# Patient Record
Sex: Female | Born: 1970 | Race: White | Hispanic: Yes | Marital: Single | Smoking: Never smoker
Health system: Southern US, Community
[De-identification: ages and names within clinical notes are randomized; demographics above are authoritative.]

## PROBLEM LIST (undated history)

## (undated) DIAGNOSIS — I2699 Other pulmonary embolism without acute cor pulmonale: Secondary | ICD-10-CM

## (undated) DIAGNOSIS — N12 Tubulo-interstitial nephritis, not specified as acute or chronic: Secondary | ICD-10-CM

## (undated) HISTORY — PX: NO PAST SURGERIES: SHX2092

---

## 2000-10-08 ENCOUNTER — Ambulatory Visit (HOSPITAL_COMMUNITY): Admission: RE | Admit: 2000-10-08 | Discharge: 2000-10-08 | Payer: Self-pay | Admitting: *Deleted

## 2001-01-01 ENCOUNTER — Ambulatory Visit (HOSPITAL_COMMUNITY): Admission: RE | Admit: 2001-01-01 | Discharge: 2001-01-01 | Payer: Self-pay | Admitting: Obstetrics

## 2001-01-29 ENCOUNTER — Inpatient Hospital Stay (HOSPITAL_COMMUNITY): Admission: AD | Admit: 2001-01-29 | Discharge: 2001-02-01 | Payer: Self-pay | Admitting: *Deleted

## 2003-02-17 ENCOUNTER — Inpatient Hospital Stay (HOSPITAL_COMMUNITY): Admission: AD | Admit: 2003-02-17 | Discharge: 2003-02-18 | Payer: Self-pay | Admitting: Obstetrics & Gynecology

## 2003-02-18 ENCOUNTER — Encounter: Payer: Self-pay | Admitting: Obstetrics & Gynecology

## 2004-06-25 ENCOUNTER — Ambulatory Visit: Payer: Self-pay | Admitting: Family Medicine

## 2004-08-24 ENCOUNTER — Ambulatory Visit: Payer: Self-pay | Admitting: Obstetrics and Gynecology

## 2004-08-24 ENCOUNTER — Inpatient Hospital Stay (HOSPITAL_COMMUNITY): Admission: AD | Admit: 2004-08-24 | Discharge: 2004-08-26 | Payer: Self-pay | Admitting: *Deleted

## 2007-09-28 ENCOUNTER — Emergency Department (HOSPITAL_COMMUNITY): Admission: EM | Admit: 2007-09-28 | Discharge: 2007-09-28 | Payer: Self-pay | Admitting: Emergency Medicine

## 2007-11-06 ENCOUNTER — Emergency Department (HOSPITAL_COMMUNITY): Admission: EM | Admit: 2007-11-06 | Discharge: 2007-11-06 | Payer: Self-pay | Admitting: Emergency Medicine

## 2010-09-19 ENCOUNTER — Inpatient Hospital Stay (HOSPITAL_COMMUNITY)
Admission: AD | Admit: 2010-09-19 | Discharge: 2010-09-21 | DRG: 775 | Disposition: A | Discharge status: Home / Self Care | Payer: Medicaid Other | Source: Ambulatory Visit | Attending: Obstetrics and Gynecology | Admitting: Obstetrics and Gynecology

## 2010-09-19 ENCOUNTER — Inpatient Hospital Stay (HOSPITAL_COMMUNITY)
Admission: AD | Admit: 2010-09-19 | Discharge: 2010-09-19 | Disposition: A | Discharge status: Home / Self Care | Payer: Medicaid Other | Source: Ambulatory Visit | Attending: Obstetrics and Gynecology | Admitting: Obstetrics and Gynecology

## 2010-09-19 DIAGNOSIS — O479 False labor, unspecified: Secondary | ICD-10-CM

## 2010-09-19 DIAGNOSIS — O09529 Supervision of elderly multigravida, unspecified trimester: Secondary | ICD-10-CM

## 2010-09-19 LAB — CBC
HCT: 40.9 % (ref 36.0–46.0)
Hemoglobin: 14.4 g/dL (ref 12.0–15.0)
MCH: 31 pg (ref 26.0–34.0)
MCHC: 35.2 g/dL (ref 30.0–36.0)
MCV: 88 fL (ref 78.0–100.0)
Platelets: 233 K/uL (ref 150–400)
RBC: 4.65 MIL/uL (ref 3.87–5.11)
RDW: 13.6 % (ref 11.5–15.5)
WBC: 10.3 K/uL (ref 4.0–10.5)

## 2010-09-19 LAB — DIFFERENTIAL
Basophils Absolute: 0 10*3/uL (ref 0.0–0.1)
Basophils Relative: 0 % (ref 0–1)
Eosinophils Absolute: 0 10*3/uL (ref 0.0–0.7)
Eosinophils Relative: 0 % (ref 0–5)
Lymphocytes Relative: 23 % (ref 12–46)
Lymphs Abs: 2.4 10*3/uL (ref 0.7–4.0)
Monocytes Absolute: 0.5 10*3/uL (ref 0.1–1.0)
Monocytes Relative: 5 % (ref 3–12)
Neutro Abs: 7.4 10*3/uL (ref 1.7–7.7)
Neutrophils Relative %: 72 % (ref 43–77)

## 2010-09-19 LAB — HEPATITIS B SURFACE ANTIGEN: Hepatitis B Surface Ag: NEGATIVE

## 2010-09-19 LAB — ABO/RH: ABO/RH(D): A POS

## 2010-09-19 LAB — TYPE AND SCREEN
ABO/RH(D): A POS
Antibody Screen: NEGATIVE

## 2010-09-19 LAB — RPR: RPR Ser Ql: NONREACTIVE

## 2010-09-19 LAB — HIV ANTIBODY (ROUTINE TESTING W REFLEX): HIV: NONREACTIVE

## 2010-09-19 LAB — RUBELLA SCREEN: Rubella: 52.2 IU/mL — ABNORMAL HIGH

## 2010-09-20 DIAGNOSIS — O479 False labor, unspecified: Secondary | ICD-10-CM

## 2010-09-21 DIAGNOSIS — O479 False labor, unspecified: Secondary | ICD-10-CM

## 2010-09-21 LAB — STREP B DNA PROBE

## 2011-04-25 LAB — URINE MICROSCOPIC-ADD ON

## 2011-04-25 LAB — URINE CULTURE

## 2011-04-25 LAB — I-STAT 8, (EC8 V) (CONVERTED LAB)
BUN: 5 — ABNORMAL LOW
Bicarbonate: 24.4 — ABNORMAL HIGH
HCT: 45
Hemoglobin: 15.3 — ABNORMAL HIGH
Operator id: 294501
Sodium: 139

## 2011-04-25 LAB — DIFFERENTIAL
Basophils Absolute: 0
Basophils Relative: 1
Monocytes Relative: 3
Neutro Abs: 6.3
Neutrophils Relative %: 67

## 2011-04-25 LAB — URINALYSIS, ROUTINE W REFLEX MICROSCOPIC
Nitrite: POSITIVE — AB
Specific Gravity, Urine: 1.013
Urobilinogen, UA: 0.2

## 2011-04-25 LAB — CBC
MCHC: 34.4
RBC: 5.14 — ABNORMAL HIGH
RDW: 13

## 2011-04-29 LAB — DIFFERENTIAL
Basophils Absolute: 0
Basophils Relative: 0
Lymphocytes Relative: 25
Monocytes Absolute: 0.3
Neutro Abs: 6.7
Neutrophils Relative %: 70

## 2011-04-29 LAB — COMPREHENSIVE METABOLIC PANEL
Albumin: 4.3
Alkaline Phosphatase: 64
BUN: 4 — ABNORMAL LOW
Chloride: 104
Creatinine, Ser: 0.61
GFR calc non Af Amer: >60
Glucose, Bld: 86
Total Bilirubin: 0.7

## 2011-04-29 LAB — WET PREP, GENITAL
Trich, Wet Prep: NONE SEEN
Yeast Wet Prep HPF POC: NONE SEEN

## 2011-04-29 LAB — URINALYSIS, ROUTINE W REFLEX MICROSCOPIC
Nitrite: POSITIVE — AB
Specific Gravity, Urine: 1.014
Urobilinogen, UA: 2 — ABNORMAL HIGH
pH: 6

## 2011-04-29 LAB — CBC
HCT: 45.5
Hemoglobin: 15.7 — ABNORMAL HIGH
MCV: 85.6
Platelets: 276
WBC: 9.7

## 2011-04-29 LAB — URINE CULTURE: Colony Count: 2000

## 2011-04-29 LAB — URINE MICROSCOPIC-ADD ON

## 2011-04-29 LAB — LIPASE, BLOOD: Lipase: 22

## 2013-03-26 ENCOUNTER — Encounter (HOSPITAL_COMMUNITY): Payer: Self-pay | Admitting: Emergency Medicine

## 2013-03-26 ENCOUNTER — Emergency Department (HOSPITAL_COMMUNITY)
Admission: EM | Admit: 2013-03-26 | Discharge: 2013-03-26 | Disposition: A | Discharge status: Home / Self Care | Payer: Self-pay | Attending: Emergency Medicine | Admitting: Emergency Medicine

## 2013-03-26 DIAGNOSIS — R5381 Other malaise: Secondary | ICD-10-CM | POA: Insufficient documentation

## 2013-03-26 DIAGNOSIS — Z3202 Encounter for pregnancy test, result negative: Secondary | ICD-10-CM | POA: Insufficient documentation

## 2013-03-26 DIAGNOSIS — R42 Dizziness and giddiness: Secondary | ICD-10-CM | POA: Insufficient documentation

## 2013-03-26 DIAGNOSIS — R531 Weakness: Secondary | ICD-10-CM

## 2013-03-26 LAB — CBC WITH DIFFERENTIAL/PLATELET
Basophils Absolute: 0 10*3/uL (ref 0.0–0.1)
Basophils Relative: 0 % (ref 0–1)
Lymphocytes Relative: 34 % (ref 12–46)
Neutro Abs: 2.9 10*3/uL (ref 1.7–7.7)
Platelets: 221 10*3/uL (ref 150–400)
RDW: 13 % (ref 11.5–15.5)
WBC: 4.8 10*3/uL (ref 4.0–10.5)

## 2013-03-26 LAB — POCT I-STAT, CHEM 8
Glucose, Bld: 106 mg/dL — ABNORMAL HIGH (ref 70–99)
HCT: 43 % (ref 36.0–46.0)
Hemoglobin: 14.6 g/dL (ref 12.0–15.0)
Potassium: 3.5 mEq/L (ref 3.5–5.1)
Sodium: 141 mEq/L (ref 135–145)
TCO2: 24 mmol/L (ref 0–100)

## 2013-03-26 LAB — URINALYSIS, ROUTINE W REFLEX MICROSCOPIC
Ketones, ur: NEGATIVE mg/dL
Leukocytes, UA: NEGATIVE
Nitrite: NEGATIVE
Specific Gravity, Urine: 1.01 (ref 1.005–1.030)
Urobilinogen, UA: 0.2 mg/dL (ref 0.0–1.0)
pH: 8 (ref 5.0–8.0)

## 2013-03-26 LAB — URINE MICROSCOPIC-ADD ON

## 2013-03-26 MED ORDER — SODIUM CHLORIDE 0.9 % IV BOLUS (SEPSIS)
1000.0000 mL | Freq: Once | INTRAVENOUS | Status: AC
  Administered 2013-03-26: 1000 mL via INTRAVENOUS

## 2013-03-26 NOTE — ED Notes (Signed)
Patient discharged with instructions.

## 2013-03-26 NOTE — Progress Notes (Signed)
Met Patient at bedside.Role of case manager explained.Patient reports her understanding.Patient reports she attends the [facility] and has seen her PCP in the last 12 months.Patient  Reports she has no needs at this time.Patient education provided on the [facility] on [address].No further case management needs.

## 2013-03-26 NOTE — ED Notes (Signed)
Patient advised that she had an onset of pain in the back from the neck down to the lower back, after lifting boxes all day at work. Patient advises that at midnight the pain got worse. Denies fever or other problems.

## 2013-03-26 NOTE — ED Provider Notes (Signed)
CSN: [id number]     Arrival date & time 03/26/13  6578 History     First MD Initiated Contact with Patient 03/26/13 [phone]     No chief complaint on file.  (Consider location/radiation/quality/duration/timing/severity/associated sxs/prior Treatment) HPI  42 year old female presents complaining of "I'm not feeling well". History obtained through patient. Patient states since yesterday afternoon she has been feeling more tired than usual. Reports having lightheadedness, generalized weakness, and lack of energy. She started menstrual period yesterday as well. Otherwise she denies any other specific complaint. Specifically, denies any fever, chills, headache, double vision, chest pain, shortness of breath, productive cough, hemoptysis, back pain, carpal pain, dysuria or hematuria, numbness, localized weakness, or rash. She has been walking her job as usual. Did perform heavy lifting all day at work but this is not unusual for her. She has been drinking appropriately. She denies any recent records drugs alcohol use. She is a nonsmoker.  No past medical history on file. No past surgical history on file. No family history on file. History  Substance Use Topics  . Smoking status: Not on file  . Smokeless tobacco: Not on file  . Alcohol Use: Not on file   OB History   No data available     Review of Systems  All other systems reviewed and are negative.    Allergies  Review of patient's allergies indicates no known allergies.  Home Medications   Current Outpatient Rx  Name  Route  Sig  Dispense  Refill  . aspirin 325 MG tablet   Oral   Take 325 mg by mouth daily as needed for pain.         . Multiple Vitamins-Minerals (MULTI-VITAMIN GUMMIES PO)   Oral   Take 1 each by mouth daily.          BP 113/80  Pulse 100  Temp(Src) 98.6 F (37 C) (Oral)  SpO2 100% Physical Exam  Nursing note and vitals reviewed. Constitutional: She is oriented to person, place, and time. She  appears well-developed and well-nourished. No distress.  Awake, alert, nontoxic appearance  HENT:  Head: Atraumatic.  Mouth/Throat: Oropharynx is clear and moist.  Eyes: Conjunctivae and EOM are normal. Pupils are equal, round, and reactive to light. Right eye exhibits no discharge. Left eye exhibits no discharge.  Neck: Neck supple.  Cardiovascular: Normal rate and regular rhythm.   Pulmonary/Chest: Effort normal. No respiratory distress. She exhibits no tenderness.  Abdominal: Soft. There is no tenderness. There is no rebound.  Musculoskeletal: She exhibits no tenderness.  ROM appears intact, no obvious focal weakness  No significant midline spine tenderness.  Neurological: She is alert and oriented to person, place, and time. She has normal strength. No cranial nerve deficit or sensory deficit. She displays a negative Romberg sign. Coordination and gait normal. GCS eye subscore is 4. GCS verbal subscore is 5. GCS motor subscore is 6.  Mental status and motor strength appears intact  5 out 5 strength to all 4 extremities. Patellar deep tendon reflexes 2+ bilaterally. Normal gait.  Skin: No rash noted.  Psychiatric: She has a normal mood and affect.    ED Course   Procedures (including critical care time)   Date: 03/26/2013  Rate: 89  Rhythm: normal sinus rhythm  QRS Axis: normal  Intervals: normal  ST/T Wave abnormalities: normal  Conduction Disutrbances: none  Narrative Interpretation:   Old EKG Reviewed: none for comparison     Patient reports complaining of lightheadedness and generalized weakness. Patient  is currently on her menstruation. Her vital signs stable although she is mildly tachycardic. Plan to give IV fluid, near syncope workup initiated.  12:59 PM Patient has no focal neuro deficit. Her heart rate 22 beats per minute more from laying position to a standing position however her blood pressure remains stable. Patient has been receiving IV fluid. UA shows  hemoglobin likely secondary to her menstruation. Otherwise no evidence of urinary tract infection. Her pregnancy test is negative, normal CBG, normal left normal electrolytes, CBC shows evidence of a hemoglobin of 15.2 likely due to concentration.  1:05 PM Patient reports feeling better after receiving IV fluid. She is able to tolerates by mouth. She does have a primary care doctor in which she can followup. I believe patient is stable for discharge. Return precautions discussed.  Labs Reviewed  CBC WITH DIFFERENTIAL - Abnormal; Notable for the following:    Hemoglobin 15.2 (*)    MCHC 36.7 (*)    All other components within normal limits  URINALYSIS, ROUTINE W REFLEX MICROSCOPIC - Abnormal; Notable for the following:    Color, Urine RED (*)    Hgb urine dipstick LARGE (*)    All other components within normal limits  POCT I-STAT, CHEM 8 - Abnormal; Notable for the following:    Glucose, Bld 106 (*)    All other components within normal limits  GLUCOSE, CAPILLARY  URINE MICROSCOPIC-ADD ON  CG4 I-STAT (LACTIC ACID)  POCT PREGNANCY, URINE   No results found. 1. Weakness generalized     MDM  BP 108/79  Pulse 112  Temp(Src) 98.6 F (37 C) (Oral)  Resp 15  SpO2 99%  LMP 03/25/2013   Fayrene Helper, PA-C 03/26/13 1320

## 2013-03-26 NOTE — ED Provider Notes (Signed)
Medical screening examination/treatment/procedure(s) were performed by non-physician practitioner and as supervising physician I was immediately available for consultation/collaboration.   William Tanecia Mccay, MD 03/26/13 1457 

## 2013-04-01 ENCOUNTER — Emergency Department (HOSPITAL_COMMUNITY): Payer: Self-pay

## 2013-04-01 ENCOUNTER — Emergency Department (HOSPITAL_COMMUNITY)
Admission: EM | Admit: 2013-04-01 | Discharge: 2013-04-01 | Disposition: A | Discharge status: Home / Self Care | Payer: Self-pay | Attending: Emergency Medicine | Admitting: Emergency Medicine

## 2013-04-01 ENCOUNTER — Encounter (HOSPITAL_COMMUNITY): Payer: Self-pay | Admitting: Emergency Medicine

## 2013-04-01 DIAGNOSIS — R51 Headache: Secondary | ICD-10-CM | POA: Insufficient documentation

## 2013-04-01 DIAGNOSIS — R42 Dizziness and giddiness: Secondary | ICD-10-CM | POA: Insufficient documentation

## 2013-04-01 DIAGNOSIS — R5381 Other malaise: Secondary | ICD-10-CM | POA: Insufficient documentation

## 2013-04-01 DIAGNOSIS — Z3202 Encounter for pregnancy test, result negative: Secondary | ICD-10-CM | POA: Insufficient documentation

## 2013-04-01 LAB — URINALYSIS, ROUTINE W REFLEX MICROSCOPIC
Bilirubin Urine: NEGATIVE
Glucose, UA: NEGATIVE mg/dL
Hgb urine dipstick: NEGATIVE
Specific Gravity, Urine: 1.011 (ref 1.005–1.030)
Urobilinogen, UA: 0.2 mg/dL (ref 0.0–1.0)
pH: 7.5 (ref 5.0–8.0)

## 2013-04-01 LAB — BASIC METABOLIC PANEL
CO2: 25 mEq/L (ref 19–32)
Calcium: 9.8 mg/dL (ref 8.4–10.5)
Chloride: 102 mEq/L (ref 96–112)
Creatinine, Ser: 0.66 mg/dL (ref 0.50–1.10)
Glucose, Bld: 104 mg/dL — ABNORMAL HIGH (ref 70–99)

## 2013-04-01 LAB — CBC WITH DIFFERENTIAL/PLATELET
Eosinophils Relative: 0 % (ref 0–5)
HCT: 44.4 % (ref 36.0–46.0)
Hemoglobin: 16 g/dL — ABNORMAL HIGH (ref 12.0–15.0)
Lymphocytes Relative: 13 % (ref 12–46)
Lymphs Abs: 1.2 10*3/uL (ref 0.7–4.0)
MCV: 84.3 fL (ref 78.0–100.0)
Monocytes Absolute: 0.2 10*3/uL (ref 0.1–1.0)
Monocytes Relative: 2 % — ABNORMAL LOW (ref 3–12)
Neutro Abs: 7.5 10*3/uL (ref 1.7–7.7)
RBC: 5.27 MIL/uL — ABNORMAL HIGH (ref 3.87–5.11)
WBC: 9 10*3/uL (ref 4.0–10.5)

## 2013-04-01 LAB — PREGNANCY, URINE: Preg Test, Ur: NEGATIVE

## 2013-04-01 LAB — URINE MICROSCOPIC-ADD ON

## 2013-04-01 MED ORDER — ONDANSETRON HCL 4 MG/2ML IJ SOLN
4.0000 mg | Freq: Once | INTRAMUSCULAR | Status: AC
  Administered 2013-04-01: 4 mg via INTRAVENOUS
  Filled 2013-04-01: qty 2

## 2013-04-01 MED ORDER — MECLIZINE HCL 25 MG PO TABS: 25.0000 mg | ORAL_TABLET | Freq: Four times a day (QID) | ORAL | Status: DC

## 2013-04-01 MED ORDER — MECLIZINE HCL 25 MG PO TABS
25.0000 mg | ORAL_TABLET | Freq: Once | ORAL | Status: AC
  Administered 2013-04-01: 25 mg via ORAL
  Filled 2013-04-01: qty 1

## 2013-04-01 MED ORDER — KETOROLAC TROMETHAMINE 30 MG/ML IJ SOLN
30.0000 mg | Freq: Once | INTRAMUSCULAR | Status: AC
  Administered 2013-04-01: 30 mg via INTRAVENOUS
  Filled 2013-04-01: qty 1

## 2013-04-01 MED ORDER — SODIUM CHLORIDE 0.9 % IV BOLUS (SEPSIS)
1000.0000 mL | Freq: Once | INTRAVENOUS | Status: AC
  Administered 2013-04-01: 1000 mL via INTRAVENOUS

## 2013-04-01 MED ORDER — ONDANSETRON HCL 4 MG PO TABS: 4.0000 mg | ORAL_TABLET | Freq: Four times a day (QID) | ORAL | Status: DC

## 2013-04-01 NOTE — ED Provider Notes (Signed)
Medical screening examination/treatment/procedure(s) were performed by non-physician practitioner and as supervising physician I was immediately available for consultation/collaboration.   Fauna Neuner S Kori Goins, MD 04/01/13 1626 

## 2013-04-01 NOTE — ED Provider Notes (Signed)
CSN: [id number]     Arrival date & time 04/01/13  0630 History   First MD Initiated Contact with Patient 04/01/13 [phone]     Chief Complaint  Patient presents with  . Dizziness  . Headache   (Consider location/radiation/quality/duration/timing/severity/associated sxs/prior Treatment) HPI  Megan Cantu is a 42 y.o.female without any significant PMH presents to the ER with complaints of headache, generalized mild weakness, dizziness for 1 week that is worse at night when she lays down. She describes it as rocking on a boat as opposed to spinning around in circles. She says her headache is very bothersome. She is unable to get into her PCP for another week and does not feel any better since her last headache. She feels as though something in her head is not right and requests "a brain test". She has had some coughing. No sore throat, ear pain, chest pain, abdominal pain, nausea, vomiting, diarrhea. No focal weakness, change in vision. She is still on her menstrual cycle and describes it as normal flow.     History reviewed. No pertinent past medical history. History reviewed. No pertinent past surgical history. History reviewed. No pertinent family history. History  Substance Use Topics  . Smoking status: Never Smoker   . Smokeless tobacco: Not on file  . Alcohol Use: No   OB History   Grav Para Term Preterm Abortions TAB SAB Ect Mult Living                 Review of Systems ROS is negative unless otherwise stated in the HPI  Allergies  Review of patient's allergies indicates no known allergies.  Home Medications   Current Outpatient Rx  Name  Route  Sig  Dispense  Refill  . aspirin 325 MG tablet   Oral   Take 325 mg by mouth daily as needed for pain.         . Multiple Vitamins-Minerals (MULTI-VITAMIN GUMMIES PO)   Oral   Take 1 each by mouth daily.         . meclizine (ANTIVERT) 25 MG tablet   Oral   Take 1 tablet (25 mg total) by mouth 4 (four) times daily.   28  tablet   0   . ondansetron (ZOFRAN) 4 MG tablet   Oral   Take 1 tablet (4 mg total) by mouth every 6 (six) hours.   12 tablet   0    BP 92/59  Pulse 75  Temp(Src) 98.4 F (36.9 C) (Oral)  Resp 14  SpO2 100%  LMP 03/25/2013 Physical Exam  Nursing note and vitals reviewed. Constitutional: She is oriented to person, place, and time. She appears well-developed and well-nourished. No distress.  HENT:  Head: Normocephalic and atraumatic.  Eyes: Pupils are equal, round, and reactive to light.  Neck: Normal range of motion. Neck supple.  Cardiovascular: Normal rate and regular rhythm.   Pulmonary/Chest: Effort normal.  Abdominal: Soft.  Neurological: She is alert and oriented to person, place, and time. She has normal strength. No cranial nerve deficit or sensory deficit. She displays a negative Romberg sign.  Dizziness increased with rapid side to side head movements  Skin: Skin is warm and dry.    ED Course  Procedures (including critical care time) Labs Review Labs Reviewed  URINALYSIS, ROUTINE W REFLEX MICROSCOPIC - Abnormal; Notable for the following:    APPearance CLOUDY (*)    Leukocytes, UA TRACE (*)    All other components within normal limits  CBC  WITH DIFFERENTIAL - Abnormal; Notable for the following:    RBC 5.27 (*)    Hemoglobin 16.0 (*)    Neutrophils Relative % 84 (*)    Monocytes Relative 2 (*)    All other components within normal limits  BASIC METABOLIC PANEL - Abnormal; Notable for the following:    Glucose, Bld 104 (*)    All other components within normal limits  URINE MICROSCOPIC-ADD ON - Abnormal; Notable for the following:    Squamous Epithelial / LPF MANY (*)    Bacteria, UA FEW (*)    All other components within normal limits  URINE CULTURE  PREGNANCY, URINE   Imaging Review Ct Head Wo Contrast  04/01/2013   CLINICAL DATA:  Dizziness. Headache and back pain.  EXAM: CT HEAD WITHOUT CONTRAST  TECHNIQUE: Contiguous axial images were obtained  from the base of the skull through the vertex without intravenous contrast.  COMPARISON:  None.  FINDINGS: The brainstem, cerebellum, cerebral peduncles, thalamus, basal ganglia, basilar cisterns, and ventricular system appear within normal limits. No intracranial hemorrhage, mass lesion, or acute CVA.  IMPRESSION: No significant abnormality identified.   Electronically Signed   By: Herbie Baltimore   On: 04/01/2013 08:07    MDM   1. Vertigo      Pt given 25mg  IV Antivert, 4mg  Zofran IV and 1 L NS. She endorses no longer being dizzy (and shakes her head around to check) but she is still having headache pain. Head CT is normal therefore will proceed to treat pain. Labs still pending.   Patient feeling much better. Headache has resolved. Head CT negative. Labs normal.  Patient to follow-up with PCP.  Rx> meclizine and Zofran  41 y.o.Megan Cantu's evaluation in the Emergency Department is complete. It has been determined that no acute conditions requiring further emergency intervention are present at this time. The patient/guardian have been advised of the diagnosis and plan. We have discussed signs and symptoms that warrant return to the ED, such as changes or worsening in symptoms.  Vital signs are stable at discharge. Filed Vitals:   04/01/13 1036  BP: 92/59  Pulse: 75  Temp: 98.4 F (36.9 C)  Resp: 14    Patient/guardian has voiced understanding and agreed to follow-up with the PCP or specialist.   Dorthula Matas, PA-C 04/01/13 1059

## 2013-04-01 NOTE — ED Notes (Signed)
Was seen here saturday 8/23 for same-- c/o dizziness, headache and back pain. ALert.Oriented x 3, no vomiting.

## 2013-04-02 LAB — URINE CULTURE: Colony Count: 5000

## 2014-04-05 ENCOUNTER — Emergency Department (HOSPITAL_COMMUNITY)
Admission: EM | Admit: 2014-04-05 | Discharge: 2014-04-05 | Disposition: A | Discharge status: Home / Self Care | Payer: Medicaid Other | Attending: Emergency Medicine | Admitting: Emergency Medicine

## 2014-04-05 ENCOUNTER — Encounter (HOSPITAL_COMMUNITY): Payer: Self-pay | Admitting: Emergency Medicine

## 2014-04-05 DIAGNOSIS — R000 Tachycardia, unspecified: Secondary | ICD-10-CM | POA: Insufficient documentation

## 2014-04-05 DIAGNOSIS — Z79899 Other long term (current) drug therapy: Secondary | ICD-10-CM | POA: Insufficient documentation

## 2014-04-05 DIAGNOSIS — N12 Tubulo-interstitial nephritis, not specified as acute or chronic: Secondary | ICD-10-CM | POA: Diagnosis present

## 2014-04-05 DIAGNOSIS — R109 Unspecified abdominal pain: Secondary | ICD-10-CM | POA: Insufficient documentation

## 2014-04-05 LAB — URINALYSIS, ROUTINE W REFLEX MICROSCOPIC
Bilirubin Urine: NEGATIVE
Glucose, UA: NEGATIVE mg/dL
Ketones, ur: NEGATIVE mg/dL
Nitrite: NEGATIVE
PROTEIN: 100 mg/dL — AB
Specific Gravity, Urine: 1.007 (ref 1.005–1.030)
Urobilinogen, UA: 1 mg/dL (ref 0.0–1.0)
pH: 7 (ref 5.0–8.0)

## 2014-04-05 LAB — CBC WITH DIFFERENTIAL/PLATELET
Basophils Absolute: 0 10*3/uL (ref 0.0–0.1)
Basophils Relative: 0 % (ref 0–1)
EOS ABS: 0 10*3/uL (ref 0.0–0.7)
Eosinophils Relative: 0 % (ref 0–5)
HCT: 38.3 % (ref 36.0–46.0)
Hemoglobin: 13.7 g/dL (ref 12.0–15.0)
LYMPHS ABS: 1.4 10*3/uL (ref 0.7–4.0)
Lymphocytes Relative: 14 % (ref 12–46)
MCH: 30.2 pg (ref 26.0–34.0)
MCHC: 35.8 g/dL (ref 30.0–36.0)
MCV: 84.4 fL (ref 78.0–100.0)
Monocytes Absolute: 0.6 10*3/uL (ref 0.1–1.0)
Monocytes Relative: 6 % (ref 3–12)
Neutro Abs: 7.6 10*3/uL (ref 1.7–7.7)
Neutrophils Relative %: 80 % — ABNORMAL HIGH (ref 43–77)
PLATELETS: 217 10*3/uL (ref 150–400)
RBC: 4.54 MIL/uL (ref 3.87–5.11)
RDW: 12.5 % (ref 11.5–15.5)
WBC: 9.7 10*3/uL (ref 4.0–10.5)

## 2014-04-05 LAB — COMPREHENSIVE METABOLIC PANEL
ALBUMIN: 3.5 g/dL (ref 3.5–5.2)
ALK PHOS: 56 U/L (ref 39–117)
ALT: 8 U/L (ref 0–35)
AST: 12 U/L (ref 0–37)
Anion gap: 12 (ref 5–15)
BUN: 9 mg/dL (ref 6–23)
CALCIUM: 9.4 mg/dL (ref 8.4–10.5)
CO2: 24 mEq/L (ref 19–32)
Chloride: 103 mEq/L (ref 96–112)
Creatinine, Ser: 0.7 mg/dL (ref 0.50–1.10)
GFR calc non Af Amer: >90 mL/min (ref >90)
GLUCOSE: 111 mg/dL — AB (ref 70–99)
Potassium: 3.7 mEq/L (ref 3.7–5.3)
SODIUM: 139 meq/L (ref 137–147)
TOTAL PROTEIN: 7.3 g/dL (ref 6.0–8.3)
Total Bilirubin: 0.6 mg/dL (ref 0.3–1.2)

## 2014-04-05 LAB — URINE MICROSCOPIC-ADD ON

## 2014-04-05 LAB — I-STAT CG4 LACTIC ACID, ED: Lactic Acid, Venous: 0.38 mmol/L — ABNORMAL LOW (ref 0.5–2.2)

## 2014-04-05 MED ORDER — SODIUM CHLORIDE 0.9 % IV BOLUS (SEPSIS)
1000.0000 mL | Freq: Once | INTRAVENOUS | Status: AC
  Administered 2014-04-05: 1000 mL via INTRAVENOUS

## 2014-04-05 MED ORDER — DEXTROSE 5 % IV SOLN
1.0000 g | INTRAVENOUS | Status: DC
  Administered 2014-04-05: 1 g via INTRAVENOUS
  Filled 2014-04-05: qty 10

## 2014-04-05 MED ORDER — PHENAZOPYRIDINE HCL 200 MG PO TABS: 200.0000 mg | ORAL_TABLET | Freq: Three times a day (TID) | ORAL | Status: DC | PRN

## 2014-04-05 MED ORDER — SODIUM CHLORIDE 0.9 % IV BOLUS (SEPSIS): 1000.0000 mL | Freq: Once | INTRAVENOUS | Status: DC

## 2014-04-05 MED ORDER — CEPHALEXIN 500 MG PO CAPS: 500.0000 mg | ORAL_CAPSULE | Freq: Three times a day (TID) | ORAL | Status: AC

## 2014-04-05 MED ORDER — HYDROCODONE-ACETAMINOPHEN 5-325 MG PO TABS: 1.0000 | ORAL_TABLET | ORAL | Status: DC | PRN

## 2014-04-05 MED ORDER — MORPHINE SULFATE 4 MG/ML IJ SOLN
4.0000 mg | Freq: Once | INTRAMUSCULAR | Status: AC
  Administered 2014-04-05: 4 mg via INTRAVENOUS
  Filled 2014-04-05: qty 1

## 2014-04-05 MED ORDER — ACETAMINOPHEN 500 MG PO TABS
1000.0000 mg | ORAL_TABLET | Freq: Once | ORAL | Status: AC
  Administered 2014-04-05: 1000 mg via ORAL
  Filled 2014-04-05: qty 2

## 2014-04-05 NOTE — ED Notes (Signed)
Pt in c/o dysuria and frequent urination for the last few days, c/o body aches and chills, c/o lower abd pain and lower back pain that started today

## 2014-04-05 NOTE — ED Notes (Signed)
Pt tolerated drinking water before discharge.

## 2014-04-05 NOTE — ED Provider Notes (Signed)
43 year old female presents with several days of worsening lower abdominal pain with radiation to the flank, dysuria. On exam she has a soft abdomen with tenderness in the suprapubic region with no guarding, no masses, no peritoneal signs. Tachycardic to 115 beats per minute on my exam, strong pulses, moist mucous membranes, no nausea or vomiting.  Urinalysis confirms urinary tract infection, likely early pyelonephritis or cystitis, no leukocytosis, no fever, fluids, pain medicine, antibiotics.  On recheck, Pulse< 105, BP stayed OK, no fever, no SIRS, abx given and UCx requested  I saw and evaluated the patient, reviewed the resident's note and I agree with the findings and plan.   Vida Roller, MD 04/06/14 [phone]

## 2014-04-05 NOTE — Discharge Instructions (Signed)
Pielonefritis - Adultos  °(Pyelonephritis, Adult) ° La pielonefritis es una infección del riñón. Puede curarse rápidamente o durar mucho tiempo.  °CUIDADOS EN EL HOGAR  °· Tome los medicamentos (antibióticos) tal como se le indicó. Finalice la prescripción completa, aunque se sienta mejor. °· Cumpla con los controles médicos según las indicaciones. °· Beba gran cantidad de líquido para mantener la orina de tono claro o color amarillo pálido. °· Sólo tome la medicación según las indicaciones. °SOLICITE AYUDA DE INMEDIATO SI:  °· Tiene fiebre o síntomas que persisten durante más de 2-3 días. °· Tiene fiebre y los síntomas empeoran. °· No puede tomar los medicamentos o beber líquidos según las indicaciones. °· Siente escalofríos o tiene fiebre. °· Se siente débil o se desvanece (se desmaya). °· No mejora luego de 2 días. °ASEGÚRESE DE QUE:  °· Comprende estas instrucciones. °· Controlará su enfermedad. °· Solicitará ayuda de inmediato si no mejora o empeora. °Document Released: 07/21/2005 Document Revised: 01/20/2012 °ExitCare® Patient Information ©2015 [employer]. This information is not intended to replace advice given to you by your health care provider. Make sure you discuss any questions you have with your health care provider. ° °

## 2014-04-05 NOTE — ED Provider Notes (Signed)
CSN: [id number]     Arrival date & time 04/05/14  2011 History   First MD Initiated Contact with Patient 04/05/14 2039     Chief Complaint  Patient presents with  . Dysuria  . Abdominal Pain     (Consider location/radiation/quality/duration/timing/severity/associated sxs/prior Treatment) HPI Comments: Awa Bachicha 43 y.o. With no known medical problems presents with left flank pain, dysuria and suprapubic abdominal pain. Dysuria characterized as burning and started 4-5 days ago and has been worsening. She is also having suprapubic abdominal pain. Pain is aching in character, non radiating, worsened with urinating. No known relieving factors. No hematuria. No history of UTIs or pyelonephritis. Sharp left flank pain started in the past 2 days.   Patient is a 43 y.o. female presenting with flank pain. The history is provided by the patient.  Flank Pain This is a new problem. The current episode started yesterday. The problem occurs constantly. The problem has been gradually worsening. Associated symptoms include abdominal pain, chills and urinary symptoms. Pertinent negatives include no anorexia, arthralgias, change in bowel habit, chest pain, congestion, coughing, diaphoresis, fatigue, headaches, joint swelling, myalgias, nausea, numbness, rash, sore throat, swollen glands, vertigo, visual change, vomiting or weakness. Associated symptoms comments: Dysuria. Nothing aggravates the symptoms. She has tried nothing for the symptoms.    History reviewed. No pertinent past medical history. History reviewed. No pertinent past surgical history. History reviewed. No pertinent family history. History  Substance Use Topics  . Smoking status: Never Smoker   . Smokeless tobacco: Not on file  . Alcohol Use: No   OB History   Grav Para Term Preterm Abortions TAB SAB Ect Mult Living                 Review of Systems  Constitutional: Positive for chills. Negative for diaphoresis and fatigue.  HENT:  Negative for congestion and sore throat.   Respiratory: Negative for cough.   Cardiovascular: Negative for chest pain.  Gastrointestinal: Positive for abdominal pain. Negative for nausea, vomiting, anorexia and change in bowel habit.  Genitourinary: Positive for flank pain.  Musculoskeletal: Negative for arthralgias, joint swelling and myalgias.  Skin: Negative for rash.  Neurological: Negative for vertigo, weakness, numbness and headaches.  All other systems reviewed and are negative.     Allergies  Review of patient's allergies indicates no known allergies.  Home Medications   Prior to Admission medications   Medication Sig Start Date End Date Taking? Authorizing Provider  aspirin 325 MG tablet Take 325 mg by mouth daily as needed for pain.   Yes Historical Provider, MD  meclizine (ANTIVERT) 25 MG tablet Take 1 tablet (25 mg total) by mouth 4 (four) times daily. 04/01/13  Yes Tiffany Irine Seal, PA-C  Multiple Vitamins-Minerals (MULTI-VITAMIN GUMMIES PO) Take 1 each by mouth daily.   Yes Historical Provider, MD  ondansetron (ZOFRAN) 4 MG tablet Take 1 tablet (4 mg total) by mouth every 6 (six) hours. 04/01/13  Yes Tiffany Irine Seal, PA-C  cephALEXin (KEFLEX) 500 MG capsule Take 1 capsule (500 mg total) by mouth 3 (three) times daily. 04/05/14 04/15/14  Sena Hitch, MD  HYDROcodone-acetaminophen (NORCO/VICODIN) 5-325 MG per tablet Take 1 tablet by mouth every 4 (four) hours as needed for moderate pain or severe pain. 04/05/14   Sena Hitch, MD  phenazopyridine (PYRIDIUM) 200 MG tablet Take 1 tablet (200 mg total) by mouth 3 (three) times daily as needed for pain (for urinary pain). 04/05/14   Sena Hitch, MD   BP [phone]  Pulse 104  Temp(Src) 98.5 F (36.9 C) (Oral)  Resp 18  SpO2 98% Physical Exam  Constitutional: She is oriented to person, place, and time. She appears well-developed and well-nourished. No distress.  HENT:  Head: Normocephalic and atraumatic.  Right Ear: External  ear normal.  Left Ear: External ear normal.  Eyes: Conjunctivae and EOM are normal. Right eye exhibits no discharge. Left eye exhibits no discharge.  Neck: Normal range of motion. Neck supple. No JVD present.  Cardiovascular: Normal rate, regular rhythm and normal heart sounds.  Exam reveals no gallop and no friction rub.   No murmur heard. Pulmonary/Chest: Effort normal and breath sounds normal. No stridor. No respiratory distress. She has no wheezes. She has no rales. She exhibits no tenderness.  Abdominal: Soft. Bowel sounds are normal. She exhibits no distension. There is no tenderness. There is no rebound and no guarding.  Musculoskeletal: Normal range of motion. She exhibits no edema.  Neurological: She is alert and oriented to person, place, and time.  Skin: Skin is warm. No rash noted. She is not diaphoretic.  Psychiatric: She has a normal mood and affect. Her behavior is normal.    ED Course  Procedures (including critical care time) Labs Review Labs Reviewed  CBC WITH DIFFERENTIAL - Abnormal; Notable for the following:    Neutrophils Relative % 80 (*)    All other components within normal limits  COMPREHENSIVE METABOLIC PANEL - Abnormal; Notable for the following:    Glucose, Bld 111 (*)    All other components within normal limits  URINALYSIS, ROUTINE W REFLEX MICROSCOPIC - Abnormal; Notable for the following:    APPearance TURBID (*)    Hgb urine dipstick LARGE (*)    Protein, ur 100 (*)    Leukocytes, UA LARGE (*)    All other components within normal limits  URINE MICROSCOPIC-ADD ON - Abnormal; Notable for the following:    Squamous Epithelial / LPF FEW (*)    Bacteria, UA FEW (*)    All other components within normal limits  I-STAT CG4 LACTIC ACID, ED - Abnormal; Notable for the following:    Lactic Acid, Venous 0.38 (*)    All other components within normal limits  URINE CULTURE    Imaging Review No results found.   EKG Interpretation None      MDM    Final diagnoses:  Pyelonephritis    Pt presents with left flank pain, dysuria. No fever at home. No history of kidney stones or pyelonephritis. + CVA ttp. No fever here. Tachycardia, which improved with tylenol, morphine and IVF. Normotensive. Does NOT meet SIRS criteria. UA c/w UTI and sx c/w pyelonephritis. No leukocytosis. Symptoms resolved after treatment. Started keflex. Safe and STABLE to dc home. Gave rx for keflex, norco, and pyridium. Strong return precautions given for worsening symptoms, fever, chills, sweats, or any other alarming or concerning symptoms or issues. The patient was in agreement with the treatment plan and I answered all of their questions. The patient was stable for dc. At dc, the patient ambulated without difficulty, was moving all four extremities, symptoms improved, NAD. and AOx4 Care discussed with my attending, Dr. Hyacinth Meeker. If performed and available, imaging studies and labs reviewed.      Sena Hitch, MD 04/06/14 [phone]

## 2014-04-06 NOTE — ED Provider Notes (Signed)
I saw and evaluated the patient, reviewed the resident's note and I agree with the findings and plan.  Please see my separate note regarding my evaluation of the patient.  Clinical Impression:  Pyelonephritis  Vida Roller, MD 04/06/14 1426

## 2014-04-07 LAB — URINE CULTURE: Colony Count: >=100000

## 2014-04-09 ENCOUNTER — Telehealth (HOSPITAL_BASED_OUTPATIENT_CLINIC_OR_DEPARTMENT_OTHER): Payer: Self-pay

## 2014-04-09 NOTE — Telephone Encounter (Signed)
Post ED Visit - Positive Culture Follow-up  Culture report reviewed by antimicrobial stewardship pharmacist:  Wes Dulaney, Pharm.D., BCPS  Celedonio Miyamoto, Pharm.D., BCPS  Georgina Pillion, Pharm.D., BCPS  [place], [address].D., BCPS, AAHIVP  Estella Husk, Pharm.D., BCPS, AAHIVP  Carly Sabat, Pharm.D.  Enzo Bi, [address].D.  Positive Urine culture Treated with Cephalexin, organism sensitive to the same and no further patient follow-up is required at this time.  Arvid Right 04/09/2014, 5:26 AM

## 2014-08-04 DIAGNOSIS — I2699 Other pulmonary embolism without acute cor pulmonale: Secondary | ICD-10-CM

## 2014-08-04 HISTORY — DX: Other pulmonary embolism without acute cor pulmonale: I26.99

## 2014-08-13 ENCOUNTER — Inpatient Hospital Stay (HOSPITAL_COMMUNITY)
Admission: EM | Admit: 2014-08-13 | Discharge: 2014-08-21 | DRG: 871 | Disposition: A | Discharge status: Home / Self Care | Payer: Medicaid Other | Attending: Internal Medicine | Admitting: Internal Medicine

## 2014-08-13 ENCOUNTER — Encounter (HOSPITAL_COMMUNITY): Payer: Self-pay | Admitting: Adult Health

## 2014-08-13 DIAGNOSIS — I2699 Other pulmonary embolism without acute cor pulmonale: Secondary | ICD-10-CM | POA: Diagnosis present

## 2014-08-13 DIAGNOSIS — E872 Acidosis: Secondary | ICD-10-CM | POA: Diagnosis present

## 2014-08-13 DIAGNOSIS — R002 Palpitations: Secondary | ICD-10-CM | POA: Diagnosis present

## 2014-08-13 DIAGNOSIS — E46 Unspecified protein-calorie malnutrition: Secondary | ICD-10-CM | POA: Diagnosis present

## 2014-08-13 DIAGNOSIS — Z79899 Other long term (current) drug therapy: Secondary | ICD-10-CM

## 2014-08-13 DIAGNOSIS — J189 Pneumonia, unspecified organism: Secondary | ICD-10-CM | POA: Diagnosis present

## 2014-08-13 DIAGNOSIS — J9690 Respiratory failure, unspecified, unspecified whether with hypoxia or hypercapnia: Secondary | ICD-10-CM

## 2014-08-13 DIAGNOSIS — Z7982 Long term (current) use of aspirin: Secondary | ICD-10-CM

## 2014-08-13 DIAGNOSIS — R109 Unspecified abdominal pain: Secondary | ICD-10-CM

## 2014-08-13 DIAGNOSIS — B9620 Unspecified Escherichia coli [E. coli] as the cause of diseases classified elsewhere: Secondary | ICD-10-CM | POA: Diagnosis present

## 2014-08-13 DIAGNOSIS — E8809 Other disorders of plasma-protein metabolism, not elsewhere classified: Secondary | ICD-10-CM | POA: Diagnosis present

## 2014-08-13 DIAGNOSIS — H539 Unspecified visual disturbance: Secondary | ICD-10-CM

## 2014-08-13 DIAGNOSIS — N10 Acute tubulo-interstitial nephritis: Secondary | ICD-10-CM | POA: Diagnosis present

## 2014-08-13 DIAGNOSIS — N179 Acute kidney failure, unspecified: Secondary | ICD-10-CM

## 2014-08-13 DIAGNOSIS — R760 Raised antibody titer: Secondary | ICD-10-CM | POA: Diagnosis present

## 2014-08-13 DIAGNOSIS — A419 Sepsis, unspecified organism: Secondary | ICD-10-CM | POA: Diagnosis present

## 2014-08-13 DIAGNOSIS — B9629 Other Escherichia coli [E. coli] as the cause of diseases classified elsewhere: Secondary | ICD-10-CM | POA: Diagnosis present

## 2014-08-13 DIAGNOSIS — N170 Acute kidney failure with tubular necrosis: Secondary | ICD-10-CM | POA: Diagnosis present

## 2014-08-13 DIAGNOSIS — A4151 Sepsis due to Escherichia coli [E. coli]: Principal | ICD-10-CM | POA: Diagnosis present

## 2014-08-13 DIAGNOSIS — N049 Nephrotic syndrome with unspecified morphologic changes: Secondary | ICD-10-CM | POA: Diagnosis present

## 2014-08-13 DIAGNOSIS — E876 Hypokalemia: Secondary | ICD-10-CM | POA: Diagnosis not present

## 2014-08-13 DIAGNOSIS — H538 Other visual disturbances: Secondary | ICD-10-CM | POA: Diagnosis not present

## 2014-08-13 DIAGNOSIS — M7989 Other specified soft tissue disorders: Secondary | ICD-10-CM | POA: Diagnosis present

## 2014-08-13 DIAGNOSIS — N390 Urinary tract infection, site not specified: Secondary | ICD-10-CM | POA: Diagnosis present

## 2014-08-13 DIAGNOSIS — Z9189 Other specified personal risk factors, not elsewhere classified: Secondary | ICD-10-CM

## 2014-08-13 DIAGNOSIS — R6521 Severe sepsis with septic shock: Secondary | ICD-10-CM | POA: Diagnosis present

## 2014-08-13 DIAGNOSIS — I248 Other forms of acute ischemic heart disease: Secondary | ICD-10-CM | POA: Diagnosis present

## 2014-08-13 HISTORY — DX: Tubulo-interstitial nephritis, not specified as acute or chronic: N12

## 2014-08-13 HISTORY — DX: Other pulmonary embolism without acute cor pulmonale: I26.99

## 2014-08-13 LAB — URINALYSIS, ROUTINE W REFLEX MICROSCOPIC
Bilirubin Urine: NEGATIVE
Glucose, UA: NEGATIVE mg/dL
Ketones, ur: NEGATIVE mg/dL
Nitrite: NEGATIVE
Protein, ur: 100 mg/dL — AB
Specific Gravity, Urine: 1.011 (ref 1.005–1.030)
Urobilinogen, UA: 2 mg/dL — ABNORMAL HIGH (ref 0.0–1.0)
pH: 5 (ref 5.0–8.0)

## 2014-08-13 LAB — CBC WITH DIFFERENTIAL/PLATELET
Basophils Absolute: 0 10*3/uL (ref 0.0–0.1)
Basophils Relative: 0 % (ref 0–1)
Eosinophils Absolute: 0 10*3/uL (ref 0.0–0.7)
Eosinophils Relative: 0 % (ref 0–5)
HCT: 35.4 % — ABNORMAL LOW (ref 36.0–46.0)
HEMOGLOBIN: 12.5 g/dL (ref 12.0–15.0)
LYMPHS ABS: 0.4 10*3/uL — AB (ref 0.7–4.0)
LYMPHS PCT: 4 % — AB (ref 12–46)
MCH: 29 pg (ref 26.0–34.0)
MCHC: 35.3 g/dL (ref 30.0–36.0)
MCV: 82.1 fL (ref 78.0–100.0)
MONOS PCT: 7 % (ref 3–12)
Monocytes Absolute: 0.7 10*3/uL (ref 0.1–1.0)
Neutro Abs: 9.5 10*3/uL — ABNORMAL HIGH (ref 1.7–7.7)
Neutrophils Relative %: 89 % — ABNORMAL HIGH (ref 43–77)
Platelets: 143 10*3/uL — ABNORMAL LOW (ref 150–400)
RBC: 4.31 MIL/uL (ref 3.87–5.11)
RDW: 12.9 % (ref 11.5–15.5)
WBC: 10.6 10*3/uL — ABNORMAL HIGH (ref 4.0–10.5)

## 2014-08-13 LAB — COMPREHENSIVE METABOLIC PANEL
ALBUMIN: 2.7 g/dL — AB (ref 3.5–5.2)
ALT: 14 U/L (ref 0–35)
ANION GAP: 16 — AB (ref 5–15)
AST: 20 U/L (ref 0–37)
Alkaline Phosphatase: 84 U/L (ref 39–117)
BILIRUBIN TOTAL: 1.6 mg/dL — AB (ref 0.3–1.2)
BUN: 26 mg/dL — ABNORMAL HIGH (ref 6–23)
CALCIUM: 8.4 mg/dL (ref 8.4–10.5)
CHLORIDE: 97 meq/L (ref 96–112)
CO2: 20 mmol/L (ref 19–32)
CREATININE: 1.89 mg/dL — AB (ref 0.50–1.10)
GFR calc Af Amer: 37 mL/min — ABNORMAL LOW (ref >90)
GFR calc non Af Amer: 32 mL/min — ABNORMAL LOW (ref >90)
GLUCOSE: 103 mg/dL — AB (ref 70–99)
Potassium: 3.2 mmol/L — ABNORMAL LOW (ref 3.5–5.1)
SODIUM: 133 mmol/L — AB (ref 135–145)
TOTAL PROTEIN: 6.4 g/dL (ref 6.0–8.3)

## 2014-08-13 LAB — URINE MICROSCOPIC-ADD ON

## 2014-08-13 LAB — LIPASE, BLOOD: Lipase: 20 U/L (ref 11–59)

## 2014-08-13 LAB — I-STAT TROPONIN, ED: TROPONIN I, POC: 0 ng/mL (ref 0.00–0.08)

## 2014-08-13 LAB — POC URINE PREG, ED: PREG TEST UR: NEGATIVE

## 2014-08-13 LAB — I-STAT CG4 LACTIC ACID, ED: LACTIC ACID, VENOUS: 1.63 mmol/L (ref 0.5–2.2)

## 2014-08-13 MED ORDER — SODIUM CHLORIDE 0.9 % IV BOLUS (SEPSIS)
1000.0000 mL | Freq: Once | INTRAVENOUS | Status: AC
  Administered 2014-08-13: 1000 mL via INTRAVENOUS

## 2014-08-13 MED ORDER — DEXTROSE 5 % IV SOLN
1.0000 g | Freq: Once | INTRAVENOUS | Status: AC
  Administered 2014-08-13: 1 g via INTRAVENOUS
  Filled 2014-08-13: qty 10

## 2014-08-13 MED ORDER — KETOROLAC TROMETHAMINE 30 MG/ML IJ SOLN
30.0000 mg | Freq: Once | INTRAMUSCULAR | Status: AC
  Administered 2014-08-13: 30 mg via INTRAVENOUS
  Filled 2014-08-13: qty 1

## 2014-08-13 MED ORDER — MORPHINE SULFATE 4 MG/ML IJ SOLN
4.0000 mg | Freq: Once | INTRAMUSCULAR | Status: AC
  Administered 2014-08-13: 4 mg via INTRAVENOUS
  Filled 2014-08-13: qty 1

## 2014-08-13 MED ORDER — ONDANSETRON HCL 4 MG/2ML IJ SOLN
4.0000 mg | Freq: Once | INTRAMUSCULAR | Status: AC
  Administered 2014-08-13: 4 mg via INTRAVENOUS
  Filled 2014-08-13: qty 2

## 2014-08-13 NOTE — ED Notes (Signed)
Presents with upper abdominal pain, back pain and headache, began 4 days ago, denies fever. Pt is spanish speaking, dnies vomiting, denies diarrhea. She is alert, HR 160. BP 103/63

## 2014-08-13 NOTE — ED Notes (Signed)
Interpreter phone used for assessment

## 2014-08-13 NOTE — ED Provider Notes (Signed)
CSN: [id number]     Arrival date & time 08/13/14  1952 History   First MD Initiated Contact with Patient 08/13/14 2007     Chief Complaint  Patient presents with  . Abdominal Pain     (Consider location/radiation/quality/duration/timing/severity/associated sxs/prior Treatment) HPI Comments: Patient is a 44 year old female with no significant past medical history who presents with abdominal pain and flank pain that started 4 days ago. The pain is located in her left flank and epigastric area and does not radiate. The pain is described as aching and severe. The pain started gradually and progressively worsened since the onset. No alleviating/aggravating factors. The patient has tried tylenol and motrin for symptoms without relief. Associated symptoms include dark urine and headache. Patient denies fever, NVD, chest pain, SOB, dysuria, constipation, abnormal vaginal bleeding/discharge.     Patient is a 43 y.o. female presenting with abdominal pain.  Abdominal Pain Associated symptoms: no chest pain, no chills, no diarrhea, no dysuria, no fatigue, no fever, no nausea, no shortness of breath and no vomiting     History reviewed. No pertinent past medical history. History reviewed. No pertinent past surgical history. History reviewed. No pertinent family history. History  Substance Use Topics  . Smoking status: Never Smoker   . Smokeless tobacco: Not on file  . Alcohol Use: No   OB History    No data available     Review of Systems  Constitutional: Negative for fever, chills and fatigue.  HENT: Negative for trouble swallowing.   Eyes: Negative for visual disturbance.  Respiratory: Negative for shortness of breath.   Cardiovascular: Negative for chest pain and palpitations.  Gastrointestinal: Positive for abdominal pain. Negative for nausea, vomiting and diarrhea.  Genitourinary: Negative for dysuria and difficulty urinating.  Musculoskeletal: Positive for back pain. Negative for  arthralgias and neck pain.  Skin: Negative for color change.  Neurological: Positive for headaches. Negative for dizziness and weakness.  Psychiatric/Behavioral: Negative for dysphoric mood.      Allergies  Review of patient's allergies indicates no known allergies.  Home Medications   Prior to Admission medications   Medication Sig Start Date End Date Taking? Authorizing Provider  aspirin 325 MG tablet Take 325 mg by mouth daily as needed for pain.   Yes Historical Provider, MD  naproxen (NAPROSYN) 500 MG tablet Take 500 mg by mouth 2 (two) times daily with a meal.   Yes Historical Provider, MD  HYDROcodone-acetaminophen (NORCO/VICODIN) 5-325 MG per tablet Take 1 tablet by mouth every 4 (four) hours as needed for moderate pain or severe pain. 04/05/14   Sena Hitch, MD  meclizine (ANTIVERT) 25 MG tablet Take 1 tablet (25 mg total) by mouth 4 (four) times daily. 04/01/13   Dorthula Matas, PA-C  Multiple Vitamins-Minerals (MULTI-VITAMIN GUMMIES PO) Take 1 each by mouth daily.    Historical Provider, MD  ondansetron (ZOFRAN) 4 MG tablet Take 1 tablet (4 mg total) by mouth every 6 (six) hours. 04/01/13   Tiffany Irine Seal, PA-C  phenazopyridine (PYRIDIUM) 200 MG tablet Take 1 tablet (200 mg total) by mouth 3 (three) times daily as needed for pain (for urinary pain). 04/05/14   Sena Hitch, MD   BP 105/72 mmHg  Pulse 133  Temp(Src) 102.1 F (38.9 C) (Oral)  Resp 15  Ht  (1.6 m)  SpO2 99%  LMP 08/08/2014 Physical Exam  Constitutional: She is oriented to person, place, and time. She appears well-developed and well-nourished. No distress.  HENT:  Head:  Normocephalic and atraumatic.  Eyes: Conjunctivae and EOM are normal. No scleral icterus.  Neck: Normal range of motion.  Cardiovascular: Normal rate and regular rhythm.  Exam reveals no gallop and no friction rub.   No murmur heard. Pulmonary/Chest: Effort normal and breath sounds normal. She has no wheezes. She has no rales. She  exhibits no tenderness.  Abdominal: Soft. She exhibits no distension. There is tenderness. There is no rebound.  Epigastric tenderness to palpation. No other focal tenderness or peritoneal signs.   Genitourinary:  Left CVA tenderness.   Musculoskeletal: Normal range of motion.  Neurological: She is alert and oriented to person, place, and time. Coordination normal.  Speech is goal-oriented. Moves limbs without ataxia.   Skin: Skin is warm and dry.  Psychiatric: She has a normal mood and affect. Her behavior is normal.  Nursing note and vitals reviewed.   ED Course  Procedures (including critical care time) Labs Review Labs Reviewed  COMPREHENSIVE METABOLIC PANEL - Abnormal; Notable for the following:    Sodium 133 (*)    Potassium 3.2 (*)    Glucose, Bld 103 (*)    BUN 26 (*)    Creatinine, Ser 1.89 (*)    Albumin 2.7 (*)    Total Bilirubin 1.6 (*)    GFR calc non Af Amer 32 (*)    GFR calc Af Amer 37 (*)    Anion gap 16 (*)    All other components within normal limits  CBC WITH DIFFERENTIAL - Abnormal; Notable for the following:    WBC 10.6 (*)    HCT 35.4 (*)    Platelets 143 (*)    Neutrophils Relative % 89 (*)    Neutro Abs 9.5 (*)    Lymphocytes Relative 4 (*)    Lymphs Abs 0.4 (*)    All other components within normal limits  URINALYSIS, ROUTINE W REFLEX MICROSCOPIC - Abnormal; Notable for the following:    Color, Urine AMBER (*)    APPearance TURBID (*)    Hgb urine dipstick MODERATE (*)    Protein, ur 100 (*)    Urobilinogen, UA 2.0 (*)    Leukocytes, UA MODERATE (*)    All other components within normal limits  URINE MICROSCOPIC-ADD ON - Abnormal; Notable for the following:    Squamous Epithelial / LPF FEW (*)    Bacteria, UA FEW (*)    All other components within normal limits  LIPASE, BLOOD  I-STAT TROPOININ, ED  POC URINE PREG, ED  I-STAT TROPOININ, ED  I-STAT CG4 LACTIC ACID, ED   CRITICAL CARE Performed by: Emilia BeckKaitlyn Alexica Schlossberg   Total critical  care time: 1 hour  Critical care time was exclusive of separately billable procedures and treating other patients.  Critical care was necessary to treat or prevent imminent or life-threatening deterioration.  Critical care was time spent personally by me on the following activities: development of treatment plan with patient and/or surrogate as well as nursing, discussions with consultants, evaluation of patient's response to treatment, examination of patient, obtaining history from patient or surrogate, ordering and performing treatments and interventions, ordering and review of laboratory studies, ordering and review of radiographic studies, pulse oximetry and re-evaluation of patient's condition.   Imaging Review Ct Renal Stone Study  08/14/2014   CLINICAL DATA:  Acute onset of left flank pain and upper abdominal pain. Initial encounter.  EXAM: CT ABDOMEN AND PELVIS WITHOUT CONTRAST  TECHNIQUE: Multidetector CT imaging of the abdomen and pelvis was performed following the  standard protocol without IV contrast.  COMPARISON:  Pelvic ultrasound performed 02/18/2003  FINDINGS: Mild bibasilar opacities likely reflect atelectasis.  There is apparent gallbladder wall thickening, raising concern for mild cholecystitis. Would correlate for associated symptoms.  Minimal vague calcification within the liver may reflect remote injury. The spleen is unremarkable in appearance. The pancreas and adrenal glands are grossly unremarkable.  There is diffuse bilateral perinephric stranding, nonspecific in appearance. Would correlate clinically for evidence of pyelonephritis. There is no evidence of hydronephrosis. No renal or ureteral stones are seen.  No free fluid is identified. The small bowel is unremarkable in appearance. The stomach is within normal limits. No acute vascular abnormalities are seen.  The appendix is normal in caliber and contains air, without evidence for appendicitis. An apparent calcified epiploic  appendage is seen along the distal descending colon. The colon is unremarkable in appearance.  The bladder is mildly distended and grossly unremarkable. The uterus is unremarkable in appearance. The ovaries are relatively symmetric. No suspicious adnexal masses are seen. No inguinal lymphadenopathy is seen.  No acute osseous abnormalities are identified.  IMPRESSION: 1. Diffuse nonspecific bilateral perinephric stranding; would correlate clinically for evidence of pyelonephritis. 2. Apparent gallbladder wall thickening could reflect mild cholecystitis. Would correlate for associated symptoms. 3. Mild bibasilar airspace opacities likely reflect atelectasis.   Electronically Signed   By: Roanna Raider M.D.   On: 08/14/2014 00:59     EKG Interpretation None      MDM   Final diagnoses:  Flank pain  Sepsis secondary to UTI  AKI (acute kidney injury)   9:31 PM Patient is febrile and tachycardic with remaining vitals stable. Patient receiving fluids and toradol. Urinalysis pending.   1:16 AM Patient's BP shows hypotension after 3 liters. Patient's temperature increased to 102 and treated with antipyretics. Patient meets sepsis criteria. Patient will have rocephin for pyelonephritis. Patient's CT shows no kidney stone but does show perinephric stranding. Patient's has acute kidney injury based on elevated creatinine. Patient will be admitted.    Emilia Beck, PA-C 08/14/14 0125  Gerhard Munch, MD 08/15/14 1620

## 2014-08-14 ENCOUNTER — Emergency Department (HOSPITAL_COMMUNITY): Payer: Medicaid Other

## 2014-08-14 DIAGNOSIS — N170 Acute kidney failure with tubular necrosis: Secondary | ICD-10-CM | POA: Diagnosis present

## 2014-08-14 DIAGNOSIS — Z79899 Other long term (current) drug therapy: Secondary | ICD-10-CM | POA: Diagnosis not present

## 2014-08-14 DIAGNOSIS — N390 Urinary tract infection, site not specified: Secondary | ICD-10-CM

## 2014-08-14 DIAGNOSIS — A419 Sepsis, unspecified organism: Secondary | ICD-10-CM | POA: Diagnosis present

## 2014-08-14 DIAGNOSIS — B9629 Other Escherichia coli [E. coli] as the cause of diseases classified elsewhere: Secondary | ICD-10-CM | POA: Diagnosis present

## 2014-08-14 DIAGNOSIS — E876 Hypokalemia: Secondary | ICD-10-CM | POA: Diagnosis present

## 2014-08-14 DIAGNOSIS — E46 Unspecified protein-calorie malnutrition: Secondary | ICD-10-CM | POA: Diagnosis present

## 2014-08-14 DIAGNOSIS — Z7982 Long term (current) use of aspirin: Secondary | ICD-10-CM | POA: Diagnosis not present

## 2014-08-14 DIAGNOSIS — N10 Acute tubulo-interstitial nephritis: Secondary | ICD-10-CM | POA: Diagnosis present

## 2014-08-14 DIAGNOSIS — N179 Acute kidney failure, unspecified: Secondary | ICD-10-CM | POA: Diagnosis present

## 2014-08-14 DIAGNOSIS — J189 Pneumonia, unspecified organism: Secondary | ICD-10-CM | POA: Diagnosis present

## 2014-08-14 DIAGNOSIS — B9620 Unspecified Escherichia coli [E. coli] as the cause of diseases classified elsewhere: Secondary | ICD-10-CM | POA: Diagnosis present

## 2014-08-14 DIAGNOSIS — A4151 Sepsis due to Escherichia coli [E. coli]: Secondary | ICD-10-CM | POA: Diagnosis present

## 2014-08-14 DIAGNOSIS — R6520 Severe sepsis without septic shock: Secondary | ICD-10-CM

## 2014-08-14 DIAGNOSIS — E8809 Other disorders of plasma-protein metabolism, not elsewhere classified: Secondary | ICD-10-CM | POA: Diagnosis present

## 2014-08-14 DIAGNOSIS — I2699 Other pulmonary embolism without acute cor pulmonale: Secondary | ICD-10-CM | POA: Diagnosis present

## 2014-08-14 DIAGNOSIS — R109 Unspecified abdominal pain: Secondary | ICD-10-CM | POA: Diagnosis present

## 2014-08-14 DIAGNOSIS — I248 Other forms of acute ischemic heart disease: Secondary | ICD-10-CM | POA: Diagnosis present

## 2014-08-14 DIAGNOSIS — E872 Acidosis: Secondary | ICD-10-CM | POA: Diagnosis present

## 2014-08-14 DIAGNOSIS — R6521 Severe sepsis with septic shock: Secondary | ICD-10-CM | POA: Diagnosis present

## 2014-08-14 DIAGNOSIS — M7989 Other specified soft tissue disorders: Secondary | ICD-10-CM | POA: Diagnosis present

## 2014-08-14 DIAGNOSIS — R002 Palpitations: Secondary | ICD-10-CM | POA: Diagnosis present

## 2014-08-14 DIAGNOSIS — N049 Nephrotic syndrome with unspecified morphologic changes: Secondary | ICD-10-CM | POA: Diagnosis present

## 2014-08-14 DIAGNOSIS — H538 Other visual disturbances: Secondary | ICD-10-CM | POA: Diagnosis not present

## 2014-08-14 LAB — BASIC METABOLIC PANEL
Anion gap: 8 (ref 5–15)
BUN: 26 mg/dL — AB (ref 6–23)
CO2: 21 mmol/L (ref 19–32)
Calcium: 7.3 mg/dL — ABNORMAL LOW (ref 8.4–10.5)
Chloride: 103 mEq/L (ref 96–112)
Creatinine, Ser: 2 mg/dL — ABNORMAL HIGH (ref 0.50–1.10)
GFR calc non Af Amer: 30 mL/min — ABNORMAL LOW (ref >90)
GFR, EST AFRICAN AMERICAN: 34 mL/min — AB (ref >90)
Glucose, Bld: 106 mg/dL — ABNORMAL HIGH (ref 70–99)
Potassium: 3.4 mmol/L — ABNORMAL LOW (ref 3.5–5.1)
Sodium: 132 mmol/L — ABNORMAL LOW (ref 135–145)

## 2014-08-14 LAB — LACTIC ACID, PLASMA
LACTIC ACID, VENOUS: 1.1 mmol/L (ref 0.5–2.2)
Lactic Acid, Venous: 1.4 mmol/L (ref 0.5–2.2)

## 2014-08-14 LAB — MRSA PCR SCREENING: MRSA by PCR: NEGATIVE

## 2014-08-14 LAB — CORTISOL: CORTISOL PLASMA: 24.9 ug/dL

## 2014-08-14 LAB — RAPID URINE DRUG SCREEN, HOSP PERFORMED
Amphetamines: NOT DETECTED
BARBITURATES: NOT DETECTED
BENZODIAZEPINES: NOT DETECTED
Cocaine: NOT DETECTED
Opiates: POSITIVE — AB
TETRAHYDROCANNABINOL: NOT DETECTED

## 2014-08-14 LAB — CBC
HCT: 31.4 % — ABNORMAL LOW (ref 36.0–46.0)
HEMOGLOBIN: 11.2 g/dL — AB (ref 12.0–15.0)
MCH: 29.5 pg (ref 26.0–34.0)
MCHC: 35.7 g/dL (ref 30.0–36.0)
MCV: 82.6 fL (ref 78.0–100.0)
Platelets: 123 10*3/uL — ABNORMAL LOW (ref 150–400)
RBC: 3.8 MIL/uL — ABNORMAL LOW (ref 3.87–5.11)
RDW: 13 % (ref 11.5–15.5)
WBC: 10.7 10*3/uL — AB (ref 4.0–10.5)

## 2014-08-14 LAB — PROCALCITONIN: PROCALCITONIN: 23.06 ng/mL

## 2014-08-14 LAB — GLUCOSE, CAPILLARY: GLUCOSE-CAPILLARY: 92 mg/dL (ref 70–99)

## 2014-08-14 MED ORDER — SODIUM CHLORIDE 0.9 % IV SOLN
INTRAVENOUS | Status: DC
  Administered 2014-08-14 (×3): via INTRAVENOUS
  Administered 2014-08-15: 150 mL/h via INTRAVENOUS

## 2014-08-14 MED ORDER — SODIUM CHLORIDE 0.9 % IV BOLUS (SEPSIS): 500.0000 mL | Freq: Once | INTRAVENOUS | Status: DC

## 2014-08-14 MED ORDER — SODIUM CHLORIDE 0.9 % IV BOLUS (SEPSIS)
1000.0000 mL | Freq: Once | INTRAVENOUS | Status: AC
  Administered 2014-08-14: 1000 mL via INTRAVENOUS

## 2014-08-14 MED ORDER — SODIUM CHLORIDE 0.9 % IV BOLUS (SEPSIS)
500.0000 mL | Freq: Once | INTRAVENOUS | Status: AC
  Administered 2014-08-14: 500 mL via INTRAVENOUS

## 2014-08-14 MED ORDER — VANCOMYCIN HCL IN DEXTROSE 1-5 GM/200ML-% IV SOLN
1000.0000 mg | INTRAVENOUS | Status: DC
  Administered 2014-08-15: 1000 mg via INTRAVENOUS
  Filled 2014-08-14: qty 200

## 2014-08-14 MED ORDER — PIPERACILLIN-TAZOBACTAM 3.375 G IVPB
3.3750 g | Freq: Three times a day (TID) | INTRAVENOUS | Status: DC
  Administered 2014-08-14 – 2014-08-16 (×7): 3.375 g via INTRAVENOUS
  Filled 2014-08-14 (×9): qty 50

## 2014-08-14 MED ORDER — MORPHINE SULFATE 2 MG/ML IJ SOLN
2.0000 mg | INTRAMUSCULAR | Status: DC | PRN
Start: 2014-08-14 — End: 2014-08-17
  Administered 2014-08-14 – 2014-08-16 (×6): 2 mg via INTRAVENOUS
  Filled 2014-08-14 (×7): qty 1

## 2014-08-14 MED ORDER — VANCOMYCIN HCL IN DEXTROSE 1-5 GM/200ML-% IV SOLN
1000.0000 mg | Freq: Once | INTRAVENOUS | Status: AC
  Administered 2014-08-14: 1000 mg via INTRAVENOUS
  Filled 2014-08-14: qty 200

## 2014-08-14 MED ORDER — ACETAMINOPHEN 325 MG PO TABS
650.0000 mg | ORAL_TABLET | Freq: Four times a day (QID) | ORAL | Status: DC | PRN
  Administered 2014-08-14 – 2014-08-16 (×3): 650 mg via ORAL
  Filled 2014-08-14 (×3): qty 2

## 2014-08-14 MED ORDER — HEPARIN SODIUM (PORCINE) 5000 UNIT/ML IJ SOLN
5000.0000 [IU] | Freq: Three times a day (TID) | INTRAMUSCULAR | Status: DC
  Administered 2014-08-14 – 2014-08-16 (×7): 5000 [IU] via SUBCUTANEOUS
  Filled 2014-08-14 (×12): qty 1

## 2014-08-14 MED ORDER — VANCOMYCIN HCL 500 MG IV SOLR
500.0000 mg | Freq: Two times a day (BID) | INTRAVENOUS | Status: DC
  Filled 2014-08-14: qty 500

## 2014-08-14 MED ORDER — ONDANSETRON HCL 4 MG/2ML IJ SOLN
4.0000 mg | Freq: Three times a day (TID) | INTRAMUSCULAR | Status: AC | PRN
  Administered 2014-08-14: 4 mg via INTRAVENOUS
  Filled 2014-08-14: qty 2

## 2014-08-14 MED ORDER — HYDROMORPHONE HCL 1 MG/ML IJ SOLN: 1.0000 mg | INTRAMUSCULAR | Status: AC | PRN

## 2014-08-14 NOTE — ED Notes (Signed)
Dr. Gardner at the bedside.  

## 2014-08-14 NOTE — Progress Notes (Signed)
UR Completed.  [phone]  

## 2014-08-14 NOTE — Consult Note (Signed)
PULMONARY / CRITICAL CARE MEDICINE   Name: Megan Cantu MRN: [id number] DOB: 1971/07/25    ADMISSION DATE:  08/13/2014 CONSULTATION DATE:  1/11  REFERRING MD :  [facility]  CHIEF COMPLAINT:  Abd pain  INITIAL PRESENTATION: Hypotension, febrile 102  STUDIES:  1/11 ct abd >neg  SIGNIFICANT EVENTS:    HISTORY OF PRESENT ILLNESS:   44 yo Hispanic female(no english) who has a history of pyelonephritis dating back to 2009 and [facility] ED with burning urine, fever 102, creatine of 2, hypotension, tachycardia 133, despite aggressive fluid resuscitation. She is awake and none ill appearing. Bladder scan reveals she is making urine, lactic acid is <2. She has been placed on Vancomycin and Zoysn. PCCM asked to consult due to refractory hypotension. She will be admitted to SDU and continue current treatment, if she worsens then we will transfer to ICU, place CVL and assume her care. Case discussed with [facility] DO Gardner.  PAST MEDICAL HISTORY :   has no past medical history on file.  has no past surgical history on file. Prior to Admission medications   Medication Sig Start Date End Date Taking? Authorizing Provider  aspirin 325 MG tablet Take 325 mg by mouth daily as needed for pain.   Yes Historical Provider, MD  naproxen (NAPROSYN) 500 MG tablet Take 500 mg by mouth 2 (two) times daily with a meal.   Yes Historical Provider, MD   No Known Allergies  FAMILY HISTORY:  has no family status information on file.  SOCIAL HISTORY:  reports that she has never smoked. She does not have any smokeless tobacco history on file. She reports that she does not drink alcohol or use illicit drugs.  REVIEW OF SYSTEMS:  NA  SUBJECTIVE:   VITAL SIGNS: Temp:  [98.2 F (36.8 C)-102.1 F (38.9 C)] 100.9 F (38.3 C) (01/11 0603) Pulse Rate:  [121-160] 131 (01/11 0603) Resp:  [15-34] 32 (01/11 0603) BP: (80-105)/(48-72) 81/49 mmHg (01/11 0603) SpO2:  [95 %-100 %] 96 % (01/11  0603) HEMODYNAMICS:   VENTILATOR SETTINGS:   INTAKE / OUTPUT: No intake or output data in the 24 hours ending 08/14/14 0626  PHYSICAL EXAMINATION: General:  Awake and alert Neuro:  Intact, very little english HEENT:  No jvd, dry oral mucosa Cardiovascular:  HSR RRR ST 133 Lungs:  CTA Abdomen:  Mild tenderness +bs Musculoskeletal: intact Skin:  warm  LABS:  CBC  Recent Labs Lab 08/13/14 2025 08/14/14 0152  WBC 10.6* 10.7*  HGB 12.5 11.2*  HCT 35.4* 31.4*  PLT 143* 123*   Coag's No results for input(s): APTT, INR in the last 168 hours. BMET  Recent Labs Lab 08/13/14 2025 08/14/14 0152  NA 133* 132*  K 3.2* 3.4*  CL 97 103  CO2 20 21  BUN 26* 26*  CREATININE 1.89* 2.00*  GLUCOSE 103* 106*   Electrolytes  Recent Labs Lab 08/13/14 2025 08/14/14 0152  CALCIUM 8.4 7.3*   Sepsis Markers  Recent Labs Lab 08/13/14 2046  LATICACIDVEN 1.63   ABG No results for input(s): PHART, PCO2ART, PO2ART in the last 168 hours. Liver Enzymes  Recent Labs Lab 08/13/14 2025  AST 20  ALT 14  ALKPHOS 84  BILITOT 1.6*  ALBUMIN 2.7*   Cardiac Enzymes No results for input(s): TROPONINI, PROBNP in the last 168 hours. Glucose No results for input(s): GLUCAP in the last 168 hours.  Imaging No results found.   ASSESSMENT / PLAN:  PULMONARY OETT A: No acute issue P:  CARDIOVASCULAR CVL  A:  Shock from presumed sepsis from pyleo P:  Fluid resuscitation If refractory will need cvl , cvp, and pressors Check cortisol   RENAL Lab Results  Component Value Date   CREATININE 2.00* 08/14/2014   CREATININE 1.89* 08/13/2014   CREATININE 0.70 04/05/2014    A:  Acute kidney injury P:   Fluid resuscitation Check bladder scan(400 cc void post check) Serial creatines OP urology follow up  GASTROINTESTINAL A:   No acute issue P:     HEMATOLOGIC A:  No acute issue P:    INFECTIOUS A:   Presumed pyleo P:   BCx21/11>> UC   1/11>>  1/11 vanc>> 1/11 zoysn>>  ENDOCRINE A:   Check cortisol level ro insuff  P:   Cortisol level  NEUROLOGIC A:   No acute issue. No english. P:   RASS goal: 0 Awake and alert   FAMILY  - Updates: No family  - Inter-disciplinary family meet or Palliative Care meeting due by:  day 7]    TODAY'S SUMMARY:  44 yo Hispanic female(no english) who has a history of pyelonephritis dating back to 2009 and again presents to [facility] ED with burning urine, fever 102, creatine of 2, hypotension, tachycardia 133, despite aggressive fluid resuscitation. She is awake and none ill appearing. Bladder scan reveals she is making urine, lactic acid is <2. She has been placed on Vancomycin and Zoysn. PCCM asked to consult due to refractory hypotension. She will be admitted to SDU and continue current treatment, if she worsens then we will transfer to ICU, place CVL and assume her care. Case discussed with [facility] DO Gardner.  Brett Canales Minor ACNP Adolph Pollack PCCM Pager [phone] till 3 pm If no answer page [phone] 08/14/2014, 6:36 AM   Reviewed above, examined.  44 yo female with dysuria, fever found to have severe sepsis from recurrent pyelonephritis.  She remains hypotensive in spite of fluid.  Lungs clear, tachycardic, abd soft.  Will change to ICU status.  Continue with IV fluids for now.  Will continue current Abx.  F/u lactic acid, procalcitonin, cortisol.  If BP remains low, then will need to consider CVL placement and pressors.  Discussed plan with pt's husband.  CC time by me independent of APP time is 40 minutes.  Coralyn Helling, MD [facility] Pulmonary/Critical Care 08/14/2014, 8:46 AM Pager:  [phone] After 3pm call: [phone]

## 2014-08-14 NOTE — ED Notes (Signed)
[facility] -- charge on 2100 (2MW) -- will take pt at 1100.

## 2014-08-14 NOTE — Progress Notes (Addendum)
ANTIBIOTIC CONSULT NOTE - INITIAL  Pharmacy Consult for Vancomycin  Indication: rule out sepsis  No Known Allergies  Patient Measurements: ~65 kg (RN estimate)   Vital Signs: Temp: 100.9 F (38.3 C) (01/11 0603) Temp Source: Oral (01/11 0603) BP: 81/49 mmHg (01/11 0603) Pulse Rate: 131 (01/11 0603)  Labs:  Recent Labs  08/13/14 2025 08/14/14 0152  WBC 10.6* 10.7*  HGB 12.5 11.2*  PLT 143* 123*  CREATININE 1.89* 2.00*   Assessment: 44 y/o F here with abdominal pain, mildly elevated WBC, possible pyelonephritis on on CT, noted to be in acute renal failure, hypotensive, other labs as above.   Goal of Therapy:  Vancomycin trough level 15-20 mcg/ml  Plan:  -Vancomycin 500 mg IV q12h, will need dose increase if renal function improves with fluids -Zosyn per MD -Trend WBC, temp, renal function  -Drug levels as indicated   Abran DukeLedford, James 08/14/2014,6:23 AM   ======================================   Addendum: - updated height and weight reported - CrCL 32 ml/min   Plan: - Change vanc to 1gm IV Q24H - Continue Zosyn as ordered - F/U de-escalate abx    Toriana Sponsel D. Laney Potashang, PharmD, BCPS Pager:  [phone] 08/14/2014, 11:53 AM

## 2014-08-14 NOTE — ED Notes (Signed)
Called for breakfast tray.  

## 2014-08-14 NOTE — ED Notes (Signed)
Critical care notified of pt's vital signs-- orders for NS IV

## 2014-08-14 NOTE — H&P (Signed)
[facility] History and Physical  Megan Cantu ZOX:[id number] DOB: [birth year] DOA: 08/13/2014  Referring physician: EDP PCP: No PCP Per Patient   Chief Complaint: Abdominal pain   HPI: Megan Cantu is a 44 y.o. female with no significant PMH who presents to the ED with abdominal pain and flank pain that onset 4 days ago.  Pain is located in her left flank and epigastric area.  No radiation.  Is aching and severe.  Gradual onset and progressive since onset.  Nothing makes pain better nor worse.  Tylenol and motrin have provided no relief.  Has dark urine and headache as well.  Denies fever subjectively (objectively has Tm of 102.1 in ED), N/V/D, chest pain, dysuria, constipation.  Review of Systems: Systems reviewed.  As above, otherwise negative  History reviewed. No pertinent past medical history. History reviewed. No pertinent past surgical history. Social History:  reports that she has never smoked. She does not have any smokeless tobacco history on file. She reports that she does not drink alcohol or use illicit drugs.  No Known Allergies  History reviewed. No pertinent family history.   Prior to Admission medications   Medication Sig Start Date End Date Taking? Authorizing Provider  aspirin 325 MG tablet Take 325 mg by mouth daily as needed for pain.   Yes Historical Provider, MD  naproxen (NAPROSYN) 500 MG tablet Take 500 mg by mouth 2 (two) times daily with a meal.   Yes Historical Provider, MD   Physical Exam: Filed Vitals:   08/14/14 0130  BP: 93/62  Pulse: 126  Temp:   Resp: 24    BP 93/62 mmHg  Pulse 126  Temp(Src) 98.7 F (37.1 C) (Oral)  Resp 24  Ht  (1.6 m)  SpO2 98%  LMP 08/08/2014  General Appearance:    Alert, oriented, no distress, appears stated age  Head:    Normocephalic, atraumatic  Eyes:    PERRL, EOMI, sclera non-icteric        Nose:   Nares without drainage or epistaxis. Mucosa, turbinates normal  Throat:   Moist mucous membranes.  Oropharynx without erythema or exudate.  Neck:   Supple. No carotid bruits.  No thyromegaly.  No lymphadenopathy.   Back:     No CVA tenderness, no spinal tenderness  Lungs:     Clear to auscultation bilaterally, without wheezes, rhonchi or rales  Chest wall:    No tenderness to palpitation  Heart:    Regular rate and rhythm without murmurs, gallops, rubs  Abdomen:     Soft, non-tender, nondistended, normal bowel sounds, no organomegaly  Genitalia:    deferred  Rectal:    deferred  Extremities:   No clubbing, cyanosis or edema.  Pulses:   2+ and symmetric all extremities  Skin:   Skin color, texture, turgor normal, no rashes or lesions  Lymph nodes:   Cervical, supraclavicular, and axillary nodes normal  Neurologic:   CNII-XII intact. Normal strength, sensation and reflexes      throughout    Labs on Admission:  Basic Metabolic Panel:  Recent Labs Lab 08/13/14 2025  NA 133*  K 3.2*  CL 97  CO2 20  GLUCOSE 103*  BUN 26*  CREATININE 1.89*  CALCIUM 8.4   Liver Function Tests:  Recent Labs Lab 08/13/14 2025  AST 20  ALT 14  ALKPHOS 84  BILITOT 1.6*  PROT 6.4  ALBUMIN 2.7*    Recent Labs Lab 08/13/14 2025  LIPASE 20   No results  for input(s): AMMONIA in the last 168 hours. CBC:  Recent Labs Lab 08/13/14 2025  WBC 10.6*  NEUTROABS 9.5*  HGB 12.5  HCT 35.4*  MCV 82.1  PLT 143*   Cardiac Enzymes: No results for input(s): CKTOTAL, CKMB, CKMBINDEX, TROPONINI in the last 168 hours.  BNP (last 3 results) No results for input(s): PROBNP in the last 8760 hours. CBG: No results for input(s): GLUCAP in the last 168 hours.  Radiological Exams on Admission: Ct Renal Stone Study  08/14/2014   CLINICAL DATA:  Acute onset of left flank pain and upper abdominal pain. Initial encounter.  EXAM: CT ABDOMEN AND PELVIS WITHOUT CONTRAST  TECHNIQUE: Multidetector CT imaging of the abdomen and pelvis was performed following the standard protocol without IV contrast.   COMPARISON:  Pelvic ultrasound performed 02/18/2003  FINDINGS: Mild bibasilar opacities likely reflect atelectasis.  There is apparent gallbladder wall thickening, raising concern for mild cholecystitis. Would correlate for associated symptoms.  Minimal vague calcification within the liver may reflect remote injury. The spleen is unremarkable in appearance. The pancreas and adrenal glands are grossly unremarkable.  There is diffuse bilateral perinephric stranding, nonspecific in appearance. Would correlate clinically for evidence of pyelonephritis. There is no evidence of hydronephrosis. No renal or ureteral stones are seen.  No free fluid is identified. The small bowel is unremarkable in appearance. The stomach is within normal limits. No acute vascular abnormalities are seen.  The appendix is normal in caliber and contains air, without evidence for appendicitis. An apparent calcified epiploic appendage is seen along the distal descending colon. The colon is unremarkable in appearance.  The bladder is mildly distended and grossly unremarkable. The uterus is unremarkable in appearance. The ovaries are relatively symmetric. No suspicious adnexal masses are seen. No inguinal lymphadenopathy is seen.  No acute osseous abnormalities are identified.  IMPRESSION: 1. Diffuse nonspecific bilateral perinephric stranding; would correlate clinically for evidence of pyelonephritis. 2. Apparent gallbladder wall thickening could reflect mild cholecystitis. Would correlate for associated symptoms. 3. Mild bibasilar airspace opacities likely reflect atelectasis.   Electronically Signed   By: Roanna RaiderJeffery  Chang M.D.   On: 08/14/2014 00:59    EKG: Independently reviewed.  Assessment/Plan Active Problems:   Sepsis secondary to UTI   Severe sepsis with acute organ dysfunction   AKI (acute kidney injury)   Pyelonephritis, acute  Impression: Severe sepsis with AKI, hypotension, secondary to a UTI with left sided  pyelonephritis  1. Severe sepsis and pyelonephritis - 1. Zosyn 2. Cultures pending 3. IVF: 3L bolus in ED, now going at 125 cc/hr 4. MAP is now 67, at goal 5. Tylenol PRN fever 6. Morphine PRN pain 2. AKI - 1. Strict intake and output 2. IVF as above for the hypotension which is causing this.    Code Status: Full Code  Family Communication: Family at bedside Disposition Plan: Admit to SDU   Time spent: 70 min  Terica Yogi M. [facility] Pager [phone]  If 7AM-7PM, please contact the day team taking care of the patient [email or web address] Password [email or web address] 08/14/2014, 1:51 AM

## 2014-08-14 NOTE — ED Notes (Signed)
Lounge chair, blanket and pillow given to husband for comfort.

## 2014-08-14 NOTE — ED Notes (Signed)
Pt returned from xray

## 2014-08-14 NOTE — Progress Notes (Addendum)
Patients BP is running low, 80/48, giving 500 cc NS bolus to finish off the 4th L of IVF.  If patients BP does not improve with this, then she has had 4L and still hypotensive with AKI.  Likely will need to discuss with PCCM re: need for vasopressors.  Rechecking lactic acid.  Spoke with Dr Bary RichardFienstein: adding cortisol level, adding vancomycin, and he is sending someone to take a look at her as well.

## 2014-08-15 ENCOUNTER — Inpatient Hospital Stay (HOSPITAL_COMMUNITY): Payer: Medicaid Other

## 2014-08-15 LAB — HEPATIC FUNCTION PANEL
ALT: 13 U/L (ref 0–35)
AST: 20 U/L (ref 0–37)
Albumin: 1.6 g/dL — ABNORMAL LOW (ref 3.5–5.2)
Alkaline Phosphatase: 76 U/L (ref 39–117)
Bilirubin, Direct: 1.1 mg/dL — ABNORMAL HIGH (ref 0.0–0.3)
Indirect Bilirubin: 0.8 mg/dL (ref 0.3–0.9)
TOTAL PROTEIN: 5 g/dL — AB (ref 6.0–8.3)
Total Bilirubin: 1.9 mg/dL — ABNORMAL HIGH (ref 0.3–1.2)

## 2014-08-15 LAB — BASIC METABOLIC PANEL
Anion gap: 9 (ref 5–15)
BUN: 24 mg/dL — AB (ref 6–23)
CALCIUM: 7.1 mg/dL — AB (ref 8.4–10.5)
CHLORIDE: 120 meq/L — AB (ref 96–112)
CO2: 12 mmol/L — ABNORMAL LOW (ref 19–32)
CREATININE: 2.73 mg/dL — AB (ref 0.50–1.10)
GFR calc non Af Amer: 20 mL/min — ABNORMAL LOW (ref >90)
GFR, EST AFRICAN AMERICAN: 24 mL/min — AB (ref >90)
Glucose, Bld: 61 mg/dL — ABNORMAL LOW (ref 70–99)
Potassium: 3.3 mmol/L — ABNORMAL LOW (ref 3.5–5.1)
Sodium: 141 mmol/L (ref 135–145)

## 2014-08-15 LAB — PROCALCITONIN: Procalcitonin: 26.12 ng/mL

## 2014-08-15 LAB — CBC
HCT: 30.2 % — ABNORMAL LOW (ref 36.0–46.0)
Hemoglobin: 10.3 g/dL — ABNORMAL LOW (ref 12.0–15.0)
MCH: 28.7 pg (ref 26.0–34.0)
MCHC: 34.1 g/dL (ref 30.0–36.0)
MCV: 84.1 fL (ref 78.0–100.0)
PLATELETS: 152 10*3/uL (ref 150–400)
RBC: 3.59 MIL/uL — ABNORMAL LOW (ref 3.87–5.11)
RDW: 14.1 % (ref 11.5–15.5)
WBC: 11.1 10*3/uL — ABNORMAL HIGH (ref 4.0–10.5)

## 2014-08-15 MED ORDER — POTASSIUM CHLORIDE 10 MEQ/100ML IV SOLN
10.0000 meq | INTRAVENOUS | Status: AC
  Administered 2014-08-15 (×4): 10 meq via INTRAVENOUS
  Filled 2014-08-15 (×2): qty 100

## 2014-08-15 MED ORDER — DEXTROSE-NACL 5-0.45 % IV SOLN
INTRAVENOUS | Status: DC
  Administered 2014-08-15: 09:00:00 via INTRAVENOUS
  Administered 2014-08-16: 1000 mL via INTRAVENOUS
  Administered 2014-08-17: via INTRAVENOUS

## 2014-08-15 MED ORDER — VANCOMYCIN HCL IN DEXTROSE 750-5 MG/150ML-% IV SOLN
750.0000 mg | INTRAVENOUS | Status: DC
  Administered 2014-08-16: 750 mg via INTRAVENOUS
  Filled 2014-08-15: qty 150

## 2014-08-15 NOTE — Progress Notes (Signed)
ANTIBIOTIC CONSULT NOTE - FOLLOW UP  Pharmacy Consult:  Vancomycin / Zosyn Indication:  Sepsis secondary to pyelonephritis  No Known Allergies  Patient Measurements: Height: 5\' 1"  (154.9 cm) Weight: 146 lb 13.2 oz (66.6 kg) IBW/kg (Calculated) : 47.8  Vital Signs: Temp: 97.8 F (36.6 C) (01/12 0808) Temp Source: Oral (01/12 0808) BP: 104/60 mmHg (01/12 1100) Pulse Rate: 112 (01/12 1015) Intake/Output from previous day: 01/11 0701 - 01/12 0700 In: 6095 [P.O.:480; I.V.:5315; IV Piggyback:300] Out: 2525 [Urine:2525] Intake/Output from this shift: Total I/O In: 760 [I.V.:460; IV Piggyback:300] Out: 400 [Urine:400]  Labs:  Recent Labs  08/13/14 2025 08/14/14 0152 08/15/14 0250  WBC 10.6* 10.7* 11.1*  HGB 12.5 11.2* 10.3*  PLT 143* 123* 152  CREATININE 1.89* 2.00* 2.73*   Estimated Creatinine Clearance: 23.4 mL/min (by C-G formula based on Cr of 2.73). No results for input(s): VANCOTROUGH, VANCOPEAK, VANCORANDOM, GENTTROUGH, GENTPEAK, GENTRANDOM, TOBRATROUGH, TOBRAPEAK, TOBRARND, AMIKACINPEAK, AMIKACINTROU, AMIKACIN in the last 72 hours.   Microbiology: Recent Results (from the past 720 hour(s))  MRSA PCR Screening     Status: None   Collection Time: 08/14/14 11:22 AM  Result Value Ref Range Status   MRSA by PCR NEGATIVE NEGATIVE Final    Comment:        The GeneXpert MRSA Assay (FDA approved for NASAL specimens only), is one component of a comprehensive MRSA colonization surveillance program. It is not intended to diagnose MRSA infection nor to guide or monitor treatment for MRSA infections.   Culture, blood (routine x 2)     Status: None (Preliminary result)   Collection Time: 08/14/14  2:02 PM  Result Value Ref Range Status   Specimen Description BLOOD RIGHT ANTECUBITAL  Final   Special Requests BOTTLES DRAWN AEROBIC AND ANAEROBIC 10CC  Final   Culture   Final           BLOOD CULTURE RECEIVED NO GROWTH TO DATE CULTURE WILL BE HELD FOR 5 DAYS BEFORE  ISSUING A FINAL NEGATIVE REPORT [facility]    Report Status PENDING  Incomplete  Culture, blood (routine x 2)     Status: None (Preliminary result)   Collection Time: 08/14/14  2:15 PM  Result Value Ref Range Status   Specimen Description BLOOD RIGHT HAND  Final   Special Requests BOTTLES DRAWN AEROBIC AND ANAEROBIC 10CC  Final   Culture   Final           BLOOD CULTURE RECEIVED NO GROWTH TO DATE CULTURE WILL BE HELD FOR 5 DAYS BEFORE ISSUING A FINAL NEGATIVE REPORT Performed at [facility]    Report Status PENDING  Incomplete     Assessment: [age] YOF presented on 08/14/14 with abdominal pain and PMH significant for previous UTI.  Pyelonephritis noted on CT 08/14/14. Patient was noted to be in acute renal failure & hypotensive at presentation. Currently being treated for sepsis secondary to pyelonephritis.  Day #3 of antibiotics, day #2 of Vancomycin and Zosyn. Afebrile for 24 hours and WBC elevated at 11.1 k/mcL. SCr has increased from 2 to 2.73 today with a CrCl of 23.534mL/min and UOP 1.376mL/kg/hr.   1/11 BCx>> 1/11 UCx>> MRSA PCR neg  Vanc 1/11 >> Zosyn 1/11 >>   Goal of Therapy:  Vancomycin trough level 15-20 mcg/ml   Plan:  - Decrease Vanc to 750 mg IVQ24H - Continue Zosyn 3.375gm IV Q8H, 4 hr infusion - Trend WBC, temp, renal function & follow-up cultures - Drug levels as indicated  -  FU de-escalate abx - consider DC vanc? - pending cx & sensitivities   - FU BP, K+ - KCl recommend x2  Bonnita Nasuti 08/15/2014,10:52 AM  Tadashi Burkel D. Laney Potash, PharmD, BCPS Pager:  [phone] 08/15/2014, 11:47 AM

## 2014-08-15 NOTE — Progress Notes (Signed)
Dr. Tasia CatchingsAhmed notified of RUQ ultrasound results.  Corliss SkainsJuan Sashia Campas RN

## 2014-08-15 NOTE — Progress Notes (Signed)
Report given to Misty StanleyLisa RN from 5W patient to room 21, family called and notified of transfer location Corliss SkainsJuan Carolyn Sylvia RN

## 2014-08-15 NOTE — Consult Note (Addendum)
PULMONARY / CRITICAL CARE MEDICINE   Name: Megan Cantu MRN: [id number] DOB: 08-Nov-1970    ADMISSION DATE:  08/13/2014 CONSULTATION DATE:  1/11  REFERRING MD :  [facility]  CHIEF COMPLAINT:  Abd pain  INITIAL PRESENTATION: 44 yo with hx of pyelo 2009, presents with ab/flank pain,  Dark urine, found to be Hypotensive, febrile 102, crt 2, tachy, failed fluid resus.   STUDIES:  1/11 ct renal study - nonspecific b/l perinephric stranding, likely pyelo?, gallbladder wall thickening mild chole?  SIGNIFICANT EVENTS:  1/11: admitted to ICU, vanc+zosyn, bolused fluid.  REVIEW OF SYSTEMS:    SUBJECTIVE: Denies any pain currently, pain improved on LLQ with morphine. wants to go home.  VITAL SIGNS: Temp:  [97.8 F (36.6 C)-99.7 F (37.6 C)] 97.8 F (36.6 C) (01/12 0808) Pulse Rate:  [102-136] 112 (01/12 1015) Resp:  [18-29] 21 (01/12 1100) BP: (85-121)/(43-74) 104/60 mmHg (01/12 1100) SpO2:  [89 %-99 %] 96 % (01/12 1015) HEMODYNAMICS:   VENTILATOR SETTINGS:   INTAKE / OUTPUT:  Intake/Output Summary (Last 24 hours) at 08/15/14 1154 Last data filed at 08/15/14 1100  Gross per 24 hour  Intake   4530 ml  Output   2925 ml  Net   1605 ml    PHYSICAL EXAMINATION: General:  Awake and alert Neuro:  Intact, very little english HEENT:  No jvd Cardiovascular:  Regular rate, no murmurs, tachycardic Lungs:  CTA Abdomen:  +Bs, no tenderness currently. Musculoskeletal: intact Skin:  warm  LABS:  CBC  Recent Labs Lab 08/13/14 2025 08/14/14 0152 08/15/14 0250  WBC 10.6* 10.7* 11.1*  HGB 12.5 11.2* 10.3*  HCT 35.4* 31.4* 30.2*  PLT 143* 123* 152   Coag's No results for input(s): APTT, INR in the last 168 hours. BMET  Recent Labs Lab 08/13/14 2025 08/14/14 0152 08/15/14 0250  NA 133* 132* 141  K 3.2* 3.4* 3.3*  CL 97 103 120*  CO2 20 21 12*  BUN 26* 26* 24*  CREATININE 1.89* 2.00* 2.73*  GLUCOSE 103* 106* 61*   Electrolytes  Recent Labs Lab 08/13/14 2025  08/14/14 0152 08/15/14 0250  CALCIUM 8.4 7.3* 7.1*   Sepsis Markers  Recent Labs Lab 08/13/14 2046 08/14/14 0620 08/14/14 1025 08/14/14 1710 08/15/14 0250  LATICACIDVEN 1.63 1.4  --  1.1  --   PROCALCITON  --   --  23.06  --  26.12   ABG No results for input(s): PHART, PCO2ART, PO2ART in the last 168 hours. Liver Enzymes  Recent Labs Lab 08/13/14 2025 08/15/14 0930  AST 20 20  ALT 14 13  ALKPHOS 84 76  BILITOT 1.6* 1.9*  ALBUMIN 2.7* 1.6*   Cardiac Enzymes No results for input(s): TROPONINI, PROBNP in the last 168 hours. Glucose  Recent Labs Lab 08/14/14 1119  GLUCAP 92    Imaging Ct Renal Stone Study  08/14/2014   CLINICAL DATA:  Acute onset of left flank pain and upper abdominal pain. Initial encounter.  EXAM: CT ABDOMEN AND PELVIS WITHOUT CONTRAST  TECHNIQUE: Multidetector CT imaging of the abdomen and pelvis was performed following the standard protocol without IV contrast.  COMPARISON:  Pelvic ultrasound performed 02/18/2003  FINDINGS: Mild bibasilar opacities likely reflect atelectasis.  There is apparent gallbladder wall thickening, raising concern for mild cholecystitis. Would correlate for associated symptoms.  Minimal vague calcification within the liver may reflect remote injury. The spleen is unremarkable in appearance. The pancreas and adrenal glands are grossly unremarkable.  There is diffuse bilateral perinephric stranding, nonspecific in appearance.  Would correlate clinically for evidence of pyelonephritis. There is no evidence of hydronephrosis. No renal or ureteral stones are seen.  No free fluid is identified. The small bowel is unremarkable in appearance. The stomach is within normal limits. No acute vascular abnormalities are seen.  The appendix is normal in caliber and contains air, without evidence for appendicitis. An apparent calcified epiploic appendage is seen along the distal descending colon. The colon is unremarkable in appearance.  The bladder  is mildly distended and grossly unremarkable. The uterus is unremarkable in appearance. The ovaries are relatively symmetric. No suspicious adnexal masses are seen. No inguinal lymphadenopathy is seen.  No acute osseous abnormalities are identified.  IMPRESSION: 1. Diffuse nonspecific bilateral perinephric stranding; would correlate clinically for evidence of pyelonephritis. 2. Apparent gallbladder wall thickening could reflect mild cholecystitis. Would correlate for associated symptoms. 3. Mild bibasilar airspace opacities likely reflect atelectasis.   Electronically Signed   By: Garald Balding M.D.   On: 08/14/2014 00:59     ASSESSMENT / PLAN:  PULMONARY A: No acute issue P:   supp O2  CARDIOVASCULAR A:  Shock from presumed sepsis from pyleo - responding to fluid P:  Continue maintenance fluid, bolus intermittently.  If refractory will need cvl , cvp, and pressors but I think she will not as she is normotensive with MAP ~70 now. Cortisol 24.9   RENAL Lab Results  Component Value Date   CREATININE 2.73* 08/15/2014   CREATININE 2.00* 08/14/2014   CREATININE 1.89* 08/13/2014    A:  Acute kidney injury - worsening today, likely ATN since didn't improve significantly with fluid, hopefully will plateau tomorrow.  Non gap met acidosis - likely 2/2 to normal saline P:   Continue Fluid resuscitation (changed to d5w 1/2 normal) Maintain normo tension to support ATN Monitor UOP closely. Having good UOP so far. Look out for post ATN diuresis.  Serial creatines  GASTROINTESTINAL A:   No acute issue P:     HEMATOLOGIC A: leukocytosis P:  See ID  INFECTIOUS A:   Presumed pyleo but will need to rule out cholecystitis Pct 26.12.  P:   BCx2 1/11>> UC  1/11>>  1/11 vanc>> 1/11 zoysn>>  Cont abx today. F/up cultures. ruq ab u/s to rule out acute cholecystitis. If no chole, can likely d/c vanc tomorrow.  ENDOCRINE A:   Normal cortisol level P:    NEUROLOGIC A:   No  acute issue. Speaks some english. P:   RASS goal: 0 Awake and alert   FAMILY  - Updates: No family  - Inter-disciplinary family meet or Palliative Care meeting due by:  day 7    TODAY'S SUMMARY:  Cont vanc+zosyn today, f/up cultures, ruq u/s, continue fluids and maintain normotension, likely has ATN, f/up BMP's.  Will transfer to step down today with [facility].   Dellia Nims, M.D PGY-1 Internal Medicine Pager [phone]   PCCM ATTENDING: I have reviewed pt's initial presentation, consultants notes and hospital database in detail. The above assessment and plan was formulated under my direction.  The history is most compelling for pyelonephritis but the gallbladder was abnormal on CT abdomen also so we will continue current abx and check RUQ [place]. She has not required vasopressors, is ambulating with minimal assistance and her Uo has improved. Her renal function should start to recover. She may safely be transferred to telemetry. [facility] will resume primary duties as of AM 1/13 and PCCM will sign off. Discussed with Dr Len Blalock, MD;  PCCM service; Mobile [phone]

## 2014-08-16 ENCOUNTER — Inpatient Hospital Stay (HOSPITAL_COMMUNITY): Payer: Medicaid Other

## 2014-08-16 DIAGNOSIS — E872 Acidosis: Secondary | ICD-10-CM

## 2014-08-16 DIAGNOSIS — R0602 Shortness of breath: Secondary | ICD-10-CM

## 2014-08-16 LAB — T4, FREE: Free T4: 1.07 ng/dL (ref 0.80–1.80)

## 2014-08-16 LAB — COMPREHENSIVE METABOLIC PANEL
ALT: 12 U/L (ref 0–35)
AST: 21 U/L (ref 0–37)
Albumin: 1.5 g/dL — ABNORMAL LOW (ref 3.5–5.2)
Alkaline Phosphatase: 85 U/L (ref 39–117)
Anion gap: 7 (ref 5–15)
BILIRUBIN TOTAL: 1.2 mg/dL (ref 0.3–1.2)
BUN: 18 mg/dL (ref 6–23)
CALCIUM: 7.5 mg/dL — AB (ref 8.4–10.5)
CHLORIDE: 115 meq/L — AB (ref 96–112)
CO2: 16 mmol/L — AB (ref 19–32)
Creatinine, Ser: 2.79 mg/dL — ABNORMAL HIGH (ref 0.50–1.10)
GFR, EST AFRICAN AMERICAN: 23 mL/min — AB (ref >90)
GFR, EST NON AFRICAN AMERICAN: 20 mL/min — AB (ref >90)
GLUCOSE: 128 mg/dL — AB (ref 70–99)
Potassium: 3 mmol/L — ABNORMAL LOW (ref 3.5–5.1)
Sodium: 138 mmol/L (ref 135–145)
Total Protein: 4.8 g/dL — ABNORMAL LOW (ref 6.0–8.3)

## 2014-08-16 LAB — HEPATITIS C ANTIBODY: HCV Ab: NEGATIVE

## 2014-08-16 LAB — CBC
HCT: 32 % — ABNORMAL LOW (ref 36.0–46.0)
Hemoglobin: 11.1 g/dL — ABNORMAL LOW (ref 12.0–15.0)
MCH: 28.5 pg (ref 26.0–34.0)
MCHC: 34.7 g/dL (ref 30.0–36.0)
MCV: 82.3 fL (ref 78.0–100.0)
PLATELETS: 195 10*3/uL (ref 150–400)
RBC: 3.89 MIL/uL (ref 3.87–5.11)
RDW: 14.6 % (ref 11.5–15.5)
WBC: 13.7 10*3/uL — ABNORMAL HIGH (ref 4.0–10.5)

## 2014-08-16 LAB — D-DIMER, QUANTITATIVE (NOT AT ARMC): D-Dimer, Quant: 17.33 ug/mL-FEU — ABNORMAL HIGH (ref 0.00–0.48)

## 2014-08-16 LAB — MAGNESIUM: Magnesium: 2.1 mg/dL (ref 1.5–2.5)

## 2014-08-16 LAB — CREATININE, URINE, RANDOM: CREATININE, URINE: 42.81 mg/dL

## 2014-08-16 LAB — HEPARIN LEVEL (UNFRACTIONATED)

## 2014-08-16 LAB — BRAIN NATRIURETIC PEPTIDE: B Natriuretic Peptide: 560.7 pg/mL — ABNORMAL HIGH (ref 0.0–100.0)

## 2014-08-16 LAB — HEPATITIS B SURFACE ANTIGEN: Hepatitis B Surface Ag: NEGATIVE

## 2014-08-16 LAB — TROPONIN I
TROPONIN I: 0.06 ng/mL — AB (ref <0.031)
Troponin I: 0.03 ng/mL (ref <0.031)

## 2014-08-16 LAB — URINE CULTURE: Colony Count: >=100000

## 2014-08-16 LAB — URIC ACID: URIC ACID, SERUM: 3.7 mg/dL (ref 2.4–7.0)

## 2014-08-16 LAB — LACTIC ACID, PLASMA: LACTIC ACID, VENOUS: 1.1 mmol/L (ref 0.5–2.2)

## 2014-08-16 LAB — TSH: TSH: 6.189 u[IU]/mL — ABNORMAL HIGH (ref 0.350–4.500)

## 2014-08-16 LAB — SODIUM, URINE, RANDOM: SODIUM UR: 57 mmol/L

## 2014-08-16 LAB — PROCALCITONIN: PROCALCITONIN: 20.35 ng/mL

## 2014-08-16 MED ORDER — POTASSIUM CHLORIDE CRYS ER 20 MEQ PO TBCR
40.0000 meq | EXTENDED_RELEASE_TABLET | Freq: Once | ORAL | Status: AC
  Administered 2014-08-16: 40 meq via ORAL
  Filled 2014-08-16: qty 2

## 2014-08-16 MED ORDER — TECHNETIUM TC 99M DIETHYLENETRIAME-PENTAACETIC ACID: 40.0000 | Freq: Once | INTRAVENOUS | Status: AC | PRN

## 2014-08-16 MED ORDER — HEPARIN (PORCINE) IN NACL 100-0.45 UNIT/ML-% IJ SOLN
1650.0000 [IU]/h | INTRAMUSCULAR | Status: DC
  Administered 2014-08-16: 1050 [IU]/h via INTRAVENOUS
  Administered 2014-08-17: 1600 [IU]/h via INTRAVENOUS
  Administered 2014-08-18: 1650 [IU]/h via INTRAVENOUS
  Filled 2014-08-16 (×5): qty 250

## 2014-08-16 MED ORDER — HEPARIN BOLUS VIA INFUSION
4000.0000 [IU] | Freq: Once | INTRAVENOUS | Status: AC
  Administered 2014-08-16: 4000 [IU] via INTRAVENOUS
  Filled 2014-08-16: qty 4000

## 2014-08-16 MED ORDER — TECHNETIUM TO 99M ALBUMIN AGGREGATED
6.0000 | Freq: Once | INTRAVENOUS | Status: AC | PRN
Start: 2014-08-16 — End: 2014-08-16
  Administered 2014-08-16: 6 via INTRAVENOUS

## 2014-08-16 MED ORDER — VANCOMYCIN HCL IN DEXTROSE 750-5 MG/150ML-% IV SOLN
750.0000 mg | INTRAVENOUS | Status: DC
  Administered 2014-08-17: 750 mg via INTRAVENOUS
  Filled 2014-08-16 (×2): qty 150

## 2014-08-16 MED ORDER — PIPERACILLIN-TAZOBACTAM 3.375 G IVPB
3.3750 g | Freq: Three times a day (TID) | INTRAVENOUS | Status: DC
  Administered 2014-08-16 – 2014-08-17 (×4): 3.375 g via INTRAVENOUS
  Filled 2014-08-16 (×8): qty 50

## 2014-08-16 MED ORDER — CEFTRIAXONE SODIUM IN DEXTROSE 20 MG/ML IV SOLN
1.0000 g | INTRAVENOUS | Status: DC
  Filled 2014-08-16: qty 50

## 2014-08-16 MED ORDER — HEPARIN BOLUS VIA INFUSION
2000.0000 [IU] | Freq: Once | INTRAVENOUS | Status: AC
  Administered 2014-08-16: 2000 [IU] via INTRAVENOUS
  Filled 2014-08-16: qty 2000

## 2014-08-16 NOTE — Progress Notes (Signed)
ANTICOAGULATION CONSULT NOTE - Follow Up Consult  Pharmacy Consult for Heparin Indication: pulmonary embolus  No Known Allergies  Patient Measurements: Height: 5\' 2"  (157.5 cm) Weight: 150 lb (68.04 kg) IBW/kg (Calculated) : 50.1 Heparin Dosing Weight: 64 kg  Vital Signs: Temp: 99.8 F (37.7 C) (01/13 1700) Temp Source: Oral (01/13 1700) BP: 119/75 mmHg (01/13 1900) Pulse Rate: 117 (01/13 1900)  Labs:  Recent Labs  08/14/14 0152 08/15/14 0250 08/16/14 0540 08/16/14 1225 08/16/14 1930  HGB 11.2* 10.3* 11.1*  --   --   HCT 31.4* 30.2* 32.0*  --   --   PLT 123* 152 195  --   --   HEPARINUNFRC  --   --   --   --  <0.10*  CREATININE 2.00* 2.73* 2.79*  --   --   TROPONINI  --   --   --  0.03  --     Estimated Creatinine Clearance: 23.8 mL/min (by C-G formula based on Cr of 2.79).   Medications:  Scheduled:  . piperacillin-tazobactam (ZOSYN)  IV  3.375 g Intravenous Q8H  . [START ON 08/17/2014] vancomycin  750 mg Intravenous Q24H   Infusions:  . dextrose 5 % and 0.45% NaCl 660 mL (08/16/14 1004)  . heparin 1,050 Units/hr (08/16/14 1225)    Assessment: 44 year old female with pulmonary embolus on IV heparin. First level undetectable. Will re-bolus and increase rate. CBC ok. No bleeding noted.   Goal of Therapy:  Heparin level 0.3-0.7 units/ml Monitor platelets by anticoagulation protocol: Yes   Plan:  Heparin bolus 4000 unit bolus. Increase heparin drip at 1300 units/hr.  Heparin level in 6 hours  Daily heparin level and CBC  Link SnufferJessica Keesha Pellum, PharmD, BCPS Clinical Pharmacist [phone]  08/16/2014,8:05 PM

## 2014-08-16 NOTE — Progress Notes (Signed)
Report called to Velna OchsKatira, RN on 2H, will transport pt. Via bed with O2 & telemetry.  Forbes Cellarelcine Takeshia Wenk, RN

## 2014-08-16 NOTE — Progress Notes (Signed)
Pt. Became very SOB & HR up to the 140's when ambulating to BR this am.  Pt. Is very concerned about her heart and wonders why it races like that.  VSS - Blood pressure 115/63, pulse 120, temperature 99.6 F (37.6 C), temperature source Oral, resp. rate 18, height 5\' 2"  (1.575 m), weight 68.04 kg (150 lb), last menstrual period 08/08/2014, SpO2 100 %., R/A.  Text page Dr. Gonzella Lexhungel, will continue to monitor and await a return call.  [facility] Cellarelcine Shanta Hartner, RN

## 2014-08-16 NOTE — Progress Notes (Signed)
[facility] PROGRESS NOTE  Gerald Stabselis Buckholtz ZOX:[id number] DOB: 01-04-1971 DOA: 08/13/2014 PCP: No PCP Per Patient  History taken with the help of a Spanish phone  interpreter ID [phone]  Brief narrative 44 year old Hispanic female with no significant past medical history paranasal for pyelonephritis in 2009) presented with abdominal and left flank pain last 4 days. Patient reports taking Tylenol and Motrin without much relief. Patient also reports dark urine and headaches. Patient was found to be febrile in the ED of 102.70F. She was tachycardic and hypotensive with acute kidney injury (creatinine of 2), and hyperdensity without fluid bolus. A CT scan of the abdomen done in the ED showed nonspecific bilateral perinephric stranding possibly secondary to pyelonephritis and thickened gallbladder. Patient admitted to the service but asked her to ICU given the development of septic shock. UA on  admission was suggestive of UTI. She was placed on empiric vancomycin and Zosyn and bolused with IV normal saline. She was placed on when necessary IV morphine for her left lower quadrant pain. Transferred to hospitalist service on 1/ 12 incision care from 1/13 but has persistent tachycardia and borderline low blood pressure. Non-gap metabolic acidosis and worsened kidney injury. EKG showing sinus tachycardia with mild ST depression in inferior leads and markedly elevated d-dimer. Patient transferred to stepdown unit for closer monitoring.   Assessment/Plan: Septic shock -Possibly from underlying pyelonephritis. Urine culture growing Escherichia coli which is mostly sensitive. Patient remains tachycardic with heart rate in 120s and borderline blood pressure. She reports some palpitations and dyspnea on exertion. Patient febrile to 100.48F this morning. Continue empiric IV vancomycin and Zosyn. Blood culture from 1/11 without any growth. Has increased leukocytosis today. -Chest x-ray done today showing right  basilar atelectasis/consolidation concerning for underlying pneumonia. Also has moderate bilateral pleural effusions. IV fluids rate reduced.  -Patient transferred to stepdown unit for closer monitoring.  Tachycardia and elevated d-dimer Patient denied chest pain but reports dyspnea on exertion. 2D Echo  with normal EF and no wall motion abnormality. Has a markedly elevated d-dimer. Denies any history of blood clots, leg swellings or recent travel. Elevated d-dimer could be related to underlying sepsis. However given some EKG changes and ongoing symptoms will order a VQ scan to rule out acute PE. Started on empiric IV heparin. Check serial troponins. Elevated TSH noted. Check free T3 and T4. -Lactic acid normal.  Acute tubular necrosis Possibly prerenal due to hypotension and sepsis.  Patient does report taking some Motrin prior to admission. FeNa >1 -Check complement level, his BS antibody, HCV antibody and HIVab. Uric acid normal. -avoid nephrotoxins. -Patient has associated non-anion gap acidosis. Imaging shows possible medical renal disease versus glomerulonephritis. -Renal consulted and will follow up with recommendations.  Hypokalemia -replenish potassium.   Abdominal pain Left lower quadrant and flank pain on presentation. Now has pain over right mid and upper quadrant. CT abdomen suggestive of pyelonephritis. No signs of acute cholecystitis on ultrasound abdomen. -Continue with when necessary morphine for pain. -Repeat CT abdomen to rule out any underlying abscess if sepsis persists.  Left  Arm swelling  noted today with some warmth. Non tender and normal ROM. Possibly from IV infiltration. Will obtain Doppler to rule out DVT if unimproved in a.m.  Diet: Regular  DVT prophylaxis: Subcutaneous heparin  Code Status: full code Family Communication: none at bedside Disposition Plan: transfer to stepdown   Consultants:  PCCM    renal   procedure  None  Antibiotics:  IV vanco and zosyn (1/10--  HPI/Subjective  reports feeling short of breath on exertion and having some palpitations. Denies any chest pain, nausea or vomiting. Denies any headache or blurred vision. Denies any leg swellings .  Objective: Filed Vitals:   08/16/14 0955  BP: 109/74  Pulse: 123  Temp:   Resp: 18    Intake/Output Summary (Last 24 hours) at 08/16/14 1402 Last data filed at 08/16/14 0941  Gross per 24 hour  Intake   1340 ml  Output   1325 ml  Net     15 ml   Filed Weights   08/14/14 1123 08/15/14 1924  Weight: 66.6 kg (146 lb 13.2 oz) 68.04 kg (150 lb)    Exam:   General: Middle aged female lying in bed in no acute distress  HEENT: No pallor, moist oral mucosa  Chest: Clear to auscultation bilaterally, no added sound  CVS: S1 and S2 tachycardic, no murmurs or gallop  Abdomen: Soft, nondistended, right upper quadrant tenderness to palpation, bowel sounds present  Extremities: Warm, no edema, swelling or tenderness, mild swelling over left arm with some warmth but no tenderness and normal range of motion  CNS: Alert and oriented    Data Reviewed: Basic Metabolic Panel:  Recent Labs Lab 08/13/14 2025 08/14/14 0152 08/15/14 0250 08/16/14 0540  NA 133* 132* 141 138  K 3.2* 3.4* 3.3* 3.0*  CL 97 103 120* 115*  CO2 20 21 12* 16*  GLUCOSE 103* 106* 61* 128*  BUN 26* 26* 24* 18  CREATININE 1.89* 2.00* 2.73* 2.79*  CALCIUM 8.4 7.3* 7.1* 7.5*   Liver Function Tests:  Recent Labs Lab 08/13/14 2025 08/15/14 0930 08/16/14 0540  AST 20 20 21   ALT 14 13 12   ALKPHOS 84 76 85  BILITOT 1.6* 1.9* 1.2  PROT 6.4 5.0* 4.8*  ALBUMIN 2.7* 1.6* 1.5*    Recent Labs Lab 08/13/14 2025  LIPASE 20   No results for input(s): AMMONIA in the last 168 hours. CBC:  Recent Labs Lab 08/13/14 2025 08/14/14 0152 08/15/14 0250 08/16/14 0540  WBC 10.6* 10.7* 11.1* 13.7*  NEUTROABS 9.5*  --   --   --   HGB  12.5 11.2* 10.3* 11.1*  HCT 35.4* 31.4* 30.2* 32.0*  MCV 82.1 82.6 84.1 82.3  PLT 143* 123* 152 195   Cardiac Enzymes: No results for input(s): CKTOTAL, CKMB, CKMBINDEX, TROPONINI in the last 168 hours. BNP (last 3 results) No results for input(s): PROBNP in the last 8760 hours. CBG:  Recent Labs Lab 08/14/14 1119  GLUCAP 92    Recent Results (from the past 240 hour(s))  Urine culture     Status: None   Collection Time: 08/13/14  9:41 PM  Result Value Ref Range Status   Specimen Description URINE, CLEAN CATCH  Final   Special Requests NONE  Final   Colony Count   Final    >=100,000 COLONIES/ML Performed at [facility]    Culture   Final    ESCHERICHIA COLI Performed at [facility]    Report Status 08/16/2014 FINAL  Final   Organism ID, Bacteria ESCHERICHIA COLI  Final      Susceptibility   Escherichia coli - MIC*    AMPICILLIN >=32 RESISTANT Resistant     CEFAZOLIN <=4 SENSITIVE Sensitive     CEFTRIAXONE <=1 SENSITIVE Sensitive     CIPROFLOXACIN <=0.25 SENSITIVE Sensitive     GENTAMICIN <=1 SENSITIVE Sensitive     LEVOFLOXACIN <=0.12 SENSITIVE Sensitive     NITROFURANTOIN <=16 SENSITIVE Sensitive  TOBRAMYCIN <=1 SENSITIVE Sensitive     TRIMETH/SULFA >=320 RESISTANT Resistant     PIP/TAZO <=4 SENSITIVE Sensitive     * ESCHERICHIA COLI  MRSA PCR Screening     Status: None   Collection Time: 08/14/14 11:22 AM  Result Value Ref Range Status   MRSA by PCR NEGATIVE NEGATIVE Final    Comment:        The GeneXpert MRSA Assay (FDA approved for NASAL specimens only), is one component of a comprehensive MRSA colonization surveillance program. It is not intended to diagnose MRSA infection nor to guide or monitor treatment for MRSA infections.   Culture, blood (routine x 2)     Status: None (Preliminary result)   Collection Time: 08/14/14  2:02 PM  Result Value Ref Range Status   Specimen Description BLOOD RIGHT ANTECUBITAL  Final   Special  Requests BOTTLES DRAWN AEROBIC AND ANAEROBIC 10CC  Final   Culture   Final           BLOOD CULTURE RECEIVED NO GROWTH TO DATE CULTURE WILL BE HELD FOR 5 DAYS BEFORE ISSUING A FINAL NEGATIVE REPORT Note: Culture results may be compromised due to an excessive volume of blood received in culture bottles. Performed at [facility]    Report Status PENDING  Incomplete  Culture, blood (routine x 2)     Status: None (Preliminary result)   Collection Time: 08/14/14  2:15 PM  Result Value Ref Range Status   Specimen Description BLOOD RIGHT HAND  Final   Special Requests BOTTLES DRAWN AEROBIC AND ANAEROBIC 10CC  Final   Culture   Final           BLOOD CULTURE RECEIVED NO GROWTH TO DATE CULTURE WILL BE HELD FOR 5 DAYS BEFORE ISSUING A FINAL NEGATIVE REPORT Note: Culture results may be compromised due to an excessive volume of blood received in culture bottles. Performed at [facility]    Report Status PENDING  Incomplete     Studies: Dg Chest Chapman 1 View  08/16/2014   CLINICAL DATA:  44 year old female with respiratory failure.  EXAM: PORTABLE CHEST - 1 VIEW  COMPARISON:  No priors.  FINDINGS: Lung volumes are low. Bibasilar opacities may reflect areas of atelectasis and/or consolidation, with superimposed moderate bilateral pleural effusions. Vascular crowding, likely accentuated by low lung volumes. Heart size is borderline enlarged. Upper mediastinal contours are poorly evaluated secondary to patient's positioning, low lung volumes and portable technique.  IMPRESSION: 1. Bibasilar atelectasis and/or consolidation, concerning for potential sequela of aspiration or underlying pneumonia. Moderate bilateral pleural effusions (right greater than left). 2. Borderline cardiomegaly with pulmonary venous congestion, but no frank pulmonary edema.   Electronically Signed   By: Trudie Reed M.D.   On: 08/16/2014 07:37   US Abdomen Limited Ruq  08/15/2014   CLINICAL DATA:  Upper abdominal  pain. Suspected gallbladder wall thickening based on CT scan.  EXAM: US ABDOMEN LIMITED - RIGHT UPPER QUADRANT  COMPARISON:  08/14/2014  FINDINGS: Gallbladder:  Tumefactive sludge in the gallbladder. No gallbladder wall thickening or pericholecystic fluid. Sonographic Murphy's sign absent.  Common bile duct:  Diameter: Mildly dilated at 6 mm, but without directly visualized choledocholithiasis.  Liver:  No focal lesion identified. Within normal limits in parenchymal echogenicity.  Other: Right pleural effusion.  Echogenic right kidney, image 13.  IMPRESSION: 1. Tumefactive sludge in the gallbladder, but no gallbladder wall thickening, pericholecystic fluid, or sonographic Murphy sign. 2. Mildly dilated CBD, without directly visualized choledocholithiasis. 3.  Right pleural effusion. 4. Abnormal echogenic right kidney -differential diagnostic considerations include acute and chronic glomerular nephritis, nephrosclerosis, acute tubular necrosis, chronic pyelonephritis, diabetic nephropathy, and others.   Electronically Signed   By: Herbie Baltimore M.D.   On: 08/15/2014 16:01    Scheduled Meds: . piperacillin-tazobactam (ZOSYN)  IV  3.375 g Intravenous Q8H  . [START ON 08/17/2014] vancomycin  750 mg Intravenous Q24H   Continuous Infusions: . dextrose 5 % and 0.45% NaCl 660 mL (08/16/14 1004)  . heparin 1,050 Units/hr (08/16/14 1225)      Time spent: 35 minutes    Ramond Darnell  [facility] Pager [phone] If 7PM-7AM, please contact night-coverage at [email or web address], password [facility] 08/16/2014, 2:02 PM  LOS: 3 days

## 2014-08-16 NOTE — Progress Notes (Signed)
Referring Provider: No ref. provider found Primary Care Physician:  No PCP Per Patient Primary Nephrologist:    Reason for Consultation:   Acute renal failure and hypotension with apparent sepsis and hypotension with urine culture positive for Ecoli    HPI: septic shock. UA on admission was suggestive of UTI. She was placed on empiric vancomycin and Zosyn and bolused with IV normal saline. She was placed on when necessary IV morphine for her left lower quadrant pain. Creatinine on admission was 2 on admission 1/11 and this has increased to 2.79 with metabolic acidosis. She has deteriorated today with a persistant fever and dyspnea and is hypotensive. She has no complaints of pain at this time. She has been administered both Vancomycin and zosyn   History reviewed. No pertinent past medical history.  History reviewed. No pertinent past surgical history.  Prior to Admission medications   Medication Sig Start Date End Date Taking? Authorizing Provider  aspirin 325 MG tablet Take 325 mg by mouth daily as needed for pain.   Yes Historical Provider, MD  naproxen (NAPROSYN) 500 MG tablet Take 500 mg by mouth 2 (two) times daily with a meal.   Yes Historical Provider, MD    Current Facility-Administered Medications  Medication Dose Route Frequency Provider Last Rate Last Dose  . acetaminophen (TYLENOL) tablet 650 mg  650 mg Oral Q6H PRN Hillary Bow, DO   650 mg at 08/14/14 2135  . dextrose 5 %-0.45 % sodium chloride infusion   Intravenous Continuous Nishant Dhungel, MD 75 mL/hr at 08/16/14 1004 660 mL at 08/16/14 1004  . heparin ADULT infusion 100 units/mL (25000 units/250 mL)  1,050 Units/hr Intravenous Continuous Dennie Fetters, RPH 10.5 mL/hr at 08/16/14 1225 1,050 Units/hr at 08/16/14 1225  . morphine 2 MG/ML injection 2-4 mg  2-4 mg Intravenous Q4H PRN Hillary Bow, DO   2 mg at 08/16/14 9604  . piperacillin-tazobactam (ZOSYN) IVPB 3.375 g  3.375 g Intravenous Q8H Dennie Fetters, RPH   3.375 g at 08/16/14 5409  . [START ON 08/17/2014] vancomycin (VANCOCIN) IVPB 750 mg/150 ml premix  750 mg Intravenous Q24H Dennie Fetters, [facility]        Allergies as of 08/13/2014  . (No Known Allergies)    History reviewed. No pertinent family history.  History   Social History  . Marital Status: Single    Spouse Name: N/A    Number of Children: N/A  . Years of Education: N/A   Occupational History  . Not on file.   Social History Main Topics  . Smoking status: Never Smoker   . Smokeless tobacco: Not on file  . Alcohol Use: No  . Drug Use: No  . Sexual Activity: Not on file   Other Topics Concern  . Not on file   Social History Narrative    Review of Systems: Gen: + fever and chills HEENT: No visual complaints, No history of Retinopathy. Normal external appearance No Epistaxis or Sore throat. No sinusitis.   CV: Denies chest pain, angina, palpitations, syncope, orthopnea, PND, peripheral edema, and claudication. Resp: + Denies dyspnea at rest, and pleurisy. GI: Denies vomiting blood, jaundice, and fecal incontinence.   Denies dysphagia or odynophagia. GU : Denies urinary burning, blood in urine, urinary frequency, urinary hesitancy, nocturnal urination, and urinary incontinence.  No renal calculi. MS: Denies joint pain, limitation of movement, and swelling, stiffness, low back pain, extremity pain. Denies muscle weakness, cramps, atrophy.  No use of non steroidal  antiinflammatory drugs. Derm: Denies rash, itching, dry skin, hives, moles, warts, or unhealing ulcers.  Psych: Denies depression, anxiety, memory loss, suicidal ideation, hallucinations, paranoia, and confusion. Heme: Denies bruising, bleeding, and enlarged lymph nodes. Neuro: No headache.  No diplopia. No dysarthria.  No dysphasia.  No history of CVA.  No Seizures. No paresthesias.  No weakness. Endocrine No DM.  No Thyroid disease.  No Adrenal disease.  Physical Exam: Vital signs in last 24  hours: Temp:  [98 F (36.7 C)-100.8 F (38.2 C)] 98.9 F (37.2 C) (01/13 1433) Pulse Rate:  [112-134] 116 (01/13 1433) Resp:  [18-33] 25 (01/13 1433) BP: (109-131)/(63-100) 131/87 mmHg (01/13 1433) SpO2:  [95 %-100 %] 100 % (01/13 1433) Weight:  [68.04 kg (150 lb)] 68.04 kg (150 lb) (01/12 1924) Last BM Date: 08/15/14 General:   Ill appearing  Head:  Normocephalic and atraumatic. Eyes:  Sclera clear, no icterus.   Conjunctiva pink. Ears:  Normal auditory acuity. Nose:  No deformity, discharge,  or lesions. Mouth:  No deformity or lesions, dentition normal. Neck:  Supple; no masses or thyromegaly. JVP not elevated Lungs:  Clear throughout to auscultation.   No wheezes, crackles, or rhonchi. No acute distress. Heart:  Regular rate and rhythm; no murmurs, clicks, rubs,  or gallops. Abdomen:  Soft, nontender and nondistended. No masses, hepatosplenomegaly or hernias noted. Normal bowel sounds, without guarding, and without rebound.   Msk:  Symmetrical without gross deformities. Normal posture. Pulses:  No carotid, renal, femoral bruits. DP and PT symmetrical and equal Extremities:  Without clubbing or edema. Neurologic:  Alert and  oriented x4;  grossly normal neurologically. Skin:  Intact without significant lesions or rashes. Cervical Nodes:  No significant cervical adenopathy. Psych:  Alert and cooperative. Normal mood and affect.  Intake/Output from previous day: 01/12 0701 - 01/13 0700 In: 2110 [I.V.:1660; IV Piggyback:450] Out: 1825 [Urine:1825] Intake/Output this shift: Total I/O In: 240 [P.O.:240] Out: 500 [Urine:500]  Lab Results:  Recent Labs  08/14/14 0152 08/15/14 0250 08/16/14 0540  WBC 10.7* 11.1* 13.7*  HGB 11.2* 10.3* 11.1*  HCT 31.4* 30.2* 32.0*  PLT 123* 152 195   BMET  Recent Labs  08/14/14 0152 08/15/14 0250 08/16/14 0540  NA 132* 141 138  K 3.4* 3.3* 3.0*  CL 103 120* 115*  CO2 21 12* 16*  GLUCOSE 106* 61* 128*  BUN 26* 24* 18   CREATININE 2.00* 2.73* 2.79*  CALCIUM 7.3* 7.1* 7.5*   LFT  Recent Labs  08/15/14 0930 08/16/14 0540  PROT 5.0* 4.8*  ALBUMIN 1.6* 1.5*  AST 20 21  ALT 13 12  ALKPHOS 76 85  BILITOT 1.9* 1.2  BILIDIR 1.1*  --   IBILI 0.8  --    PT/INR No results for input(s): LABPROT, INR in the last 72 hours. Hepatitis Panel  Recent Labs  08/16/14 0831  HEPBSAG NEGATIVE  HCVAB NEGATIVE    Studies/Results: Dg Chest Port 1 View  08/16/2014   CLINICAL DATA:  44 year old female with respiratory failure.  EXAM: PORTABLE CHEST - 1 VIEW  COMPARISON:  No priors.  FINDINGS: Lung volumes are low. Bibasilar opacities may reflect areas of atelectasis and/or consolidation, with superimposed moderate bilateral pleural effusions. Vascular crowding, likely accentuated by low lung volumes. Heart size is borderline enlarged. Upper mediastinal contours are poorly evaluated secondary to patient's positioning, low lung volumes and portable technique.  IMPRESSION: 1. Bibasilar atelectasis and/or consolidation, concerning for potential sequela of aspiration or underlying pneumonia. Moderate bilateral pleural effusions (right greater  than left). 2. Borderline cardiomegaly with pulmonary venous congestion, but no frank pulmonary edema.   Electronically Signed   By: Trudie Reed M.D.   On: 08/16/2014 07:37   US Abdomen Limited Ruq  08/15/2014   CLINICAL DATA:  Upper abdominal pain. Suspected gallbladder wall thickening based on CT scan.  EXAM: US ABDOMEN LIMITED - RIGHT UPPER QUADRANT  COMPARISON:  08/14/2014  FINDINGS: Gallbladder:  Tumefactive sludge in the gallbladder. No gallbladder wall thickening or pericholecystic fluid. Sonographic Murphy's sign absent.  Common bile duct:  Diameter: Mildly dilated at 6 mm, but without directly visualized choledocholithiasis.  Liver:  No focal lesion identified. Within normal limits in parenchymal echogenicity.  Other: Right pleural effusion.  Echogenic right kidney, image 13.   IMPRESSION: 1. Tumefactive sludge in the gallbladder, but no gallbladder wall thickening, pericholecystic fluid, or sonographic Murphy sign. 2. Mildly dilated CBD, without directly visualized choledocholithiasis. 3. Right pleural effusion. 4. Abnormal echogenic right kidney -differential diagnostic considerations include acute and chronic glomerular nephritis, nephrosclerosis, acute tubular necrosis, chronic pyelonephritis, diabetic nephropathy, and others.   Electronically Signed   By: Herbie Baltimore M.D.   On: 08/15/2014 16:01    Assessment/Plan:  Acute decompensation. I would be suspicious of pulmonary embolus and agree with V/Q and also empiric use of heparin she has progressive shock and has been transferred to step down unit. She may require pressors and fluids with close monitoring  Acute renal failure will check protein/ creatinine ratio with the presence of hypoalbuminemia this could be due to Nephrotic syndrome and would make PE and renal vein thrombosis a likely event  Anemia  Evaluate and transfuse as directed by primary team   LOS: 3 Taheerah Guldin W  :12 PM

## 2014-08-16 NOTE — Progress Notes (Signed)
Echocardiogram 2D Echocardiogram has been performed.  Megan Cantu 08/16/2014, 10:36 AM

## 2014-08-16 NOTE — Progress Notes (Signed)
ANTICOAGULATION + ANTIBIOTIC CONSULT NOTE - initial note  Pharmacy Consult for Heparin/  Vanc and Zosyn Indication: rule out PE/ sepsis coverage  No Known Allergies  Patient Measurements: Height: 5\' 2"  (157.5 cm) Weight: 150 lb (68.04 kg) IBW/kg (Calculated) : 50.1 Heparin Dosing Weight: 64 kg  Vital Signs: Temp: 100.8 F (38.2 C) (01/13 0951) Temp Source: Oral (01/13 0951) BP: 109/74 mmHg (01/13 0955) Pulse Rate: 123 (01/13 0955)  Labs:  Recent Labs  08/14/14 0152 08/15/14 0250 08/16/14 0540  HGB 11.2* 10.3* 11.1*  HCT 31.4* 30.2* 32.0*  PLT 123* 152 195  CREATININE 2.00* 2.73* 2.79*    Estimated Creatinine Clearance: 23.8 mL/min (by C-G formula based on Cr of 2.79).  Assessment:   SQ heparin changing to IV heparin. Shortness of breath, tachycardia, D-dimer 17.33, VQ scan ordered.   Last sq heparin dose given at 0544 this am.    Day # 4 antibiotics, day #3 Vanc and Zosyn for sepsis secondary to pyelonephritis.  E coli in urine culture 1/10./16.   Creatinne trended up some, BUN trended down. Doses remain appropriate for renal function.  Goal of Therapy:  Heparin level 0.3-0.7 units/ml Monitor platelets by anticoagulation protocol: Yes  Vancomycin troughs 15-20 mcg/ml   Plan:   Discontinue sq heparin.  Heparin partial bolus of 2000 units IV (~ 30 units/kg).  Heparin drip to begin at 1050 units/hr (~ 16 units/kg/hr)  Heparin level ~ 6 hours after drip begins.  Daily heparin level and CBC while on heparin.  Follow up VQ scan.   Continue Vancomycin 750 mg IV q24hrs.  Continue Zosyn 3.375 gm IV q8hrs (each over 4 hrs)  Will follow renal function, culture data, progress, and antibiotic plans.  Dennie FettersEgan, Kayleen Alig [place], [place] Pager: [phone] 08/16/2014,11:21 AM

## 2014-08-16 NOTE — Progress Notes (Signed)
Used interpretor [id number] this am to communicate with pt. And complete my assessment.  Pt. Informed RN that she was concerned about her heart, wondering why it feels like it is racing at times.  RN assisted pt. To BR and when she was going back to bed she was very SOB and HR up to the 140's per monitor.  Pt. Has been doing this and reporting this since she got her.  Will inform MD of pt. Concerns and check her VS more frequent.   Forbes Cellarelcine Norman Piacentini, RN

## 2014-08-17 DIAGNOSIS — I2699 Other pulmonary embolism without acute cor pulmonale: Secondary | ICD-10-CM

## 2014-08-17 DIAGNOSIS — R609 Edema, unspecified: Secondary | ICD-10-CM

## 2014-08-17 DIAGNOSIS — E876 Hypokalemia: Secondary | ICD-10-CM

## 2014-08-17 LAB — C3 COMPLEMENT: C3 COMPLEMENT: 84 mg/dL — AB (ref 90–180)

## 2014-08-17 LAB — HIV ANTIBODY (ROUTINE TESTING W REFLEX): HIV-1/HIV-2 Ab: NONREACTIVE

## 2014-08-17 LAB — BASIC METABOLIC PANEL
ANION GAP: 7 (ref 5–15)
BUN: 16 mg/dL (ref 6–23)
CALCIUM: 7.9 mg/dL — AB (ref 8.4–10.5)
CO2: 16 mmol/L — AB (ref 19–32)
Chloride: 119 mEq/L — ABNORMAL HIGH (ref 96–112)
Creatinine, Ser: 2.41 mg/dL — ABNORMAL HIGH (ref 0.50–1.10)
GFR calc Af Amer: 27 mL/min — ABNORMAL LOW (ref >90)
GFR calc non Af Amer: 24 mL/min — ABNORMAL LOW (ref >90)
Glucose, Bld: 120 mg/dL — ABNORMAL HIGH (ref 70–99)
Potassium: 3.2 mmol/L — ABNORMAL LOW (ref 3.5–5.1)
SODIUM: 142 mmol/L (ref 135–145)

## 2014-08-17 LAB — HEPARIN LEVEL (UNFRACTIONATED)
HEPARIN UNFRACTIONATED: 0.2 [IU]/mL — AB (ref 0.30–0.70)
HEPARIN UNFRACTIONATED: 0.35 [IU]/mL (ref 0.30–0.70)
Heparin Unfractionated: 0.29 IU/mL — ABNORMAL LOW (ref 0.30–0.70)

## 2014-08-17 LAB — PROTIME-INR
INR: 1.17 (ref 0.00–1.49)
Prothrombin Time: 15 seconds (ref 11.6–15.2)

## 2014-08-17 LAB — CBC
HCT: 29 % — ABNORMAL LOW (ref 36.0–46.0)
Hemoglobin: 10.5 g/dL — ABNORMAL LOW (ref 12.0–15.0)
MCH: 29.9 pg (ref 26.0–34.0)
MCHC: 36.2 g/dL — ABNORMAL HIGH (ref 30.0–36.0)
MCV: 82.6 fL (ref 78.0–100.0)
Platelets: 201 10*3/uL (ref 150–400)
RBC: 3.51 MIL/uL — ABNORMAL LOW (ref 3.87–5.11)
RDW: 14.8 % (ref 11.5–15.5)
WBC: 12.6 10*3/uL — ABNORMAL HIGH (ref 4.0–10.5)

## 2014-08-17 LAB — PROTEIN / CREATININE RATIO, URINE
Creatinine, Urine: 45.47 mg/dL
Protein Creatinine Ratio: 1.69 — ABNORMAL HIGH (ref 0.00–0.15)
TOTAL PROTEIN, URINE: 77 mg/dL

## 2014-08-17 LAB — TROPONIN I: Troponin I: 0.06 ng/mL — ABNORMAL HIGH (ref <0.031)

## 2014-08-17 LAB — T3, FREE: T3, Free: 1.6 pg/mL — ABNORMAL LOW (ref 2.0–4.4)

## 2014-08-17 LAB — C4 COMPLEMENT: COMPLEMENT C4, BODY FLUID: 31 mg/dL (ref 10–40)

## 2014-08-17 LAB — ANTITHROMBIN III: AntiThromb III Func: 59 % — ABNORMAL LOW (ref 75–120)

## 2014-08-17 MED ORDER — LEVOFLOXACIN IN D5W 750 MG/150ML IV SOLN
750.0000 mg | INTRAVENOUS | Status: DC
  Administered 2014-08-17 – 2014-08-19 (×2): 750 mg via INTRAVENOUS
  Filled 2014-08-17 (×3): qty 150

## 2014-08-17 MED ORDER — OXYCODONE-ACETAMINOPHEN 5-325 MG PO TABS
2.0000 | ORAL_TABLET | ORAL | Status: DC | PRN
  Administered 2014-08-19: 2 via ORAL
  Filled 2014-08-17: qty 2

## 2014-08-17 MED ORDER — WARFARIN SODIUM 7.5 MG PO TABS
7.5000 mg | ORAL_TABLET | Freq: Once | ORAL | Status: AC
  Administered 2014-08-17: 7.5 mg via ORAL
  Filled 2014-08-17: qty 1

## 2014-08-17 MED ORDER — POTASSIUM CHLORIDE CRYS ER 20 MEQ PO TBCR
40.0000 meq | EXTENDED_RELEASE_TABLET | ORAL | Status: AC
  Administered 2014-08-17 (×2): 40 meq via ORAL
  Filled 2014-08-17 (×2): qty 2

## 2014-08-17 MED ORDER — WARFARIN VIDEO
Freq: Once | Status: AC
  Administered 2014-08-17: 22:00:00

## 2014-08-17 MED ORDER — WARFARIN - PHARMACIST DOSING INPATIENT
Freq: Every day | Status: DC
  Administered 2014-08-19: 18:00:00

## 2014-08-17 MED ORDER — HEPARIN BOLUS VIA INFUSION
2500.0000 [IU] | Freq: Once | INTRAVENOUS | Status: AC
  Administered 2014-08-17: 2500 [IU] via INTRAVENOUS
  Filled 2014-08-17: qty 2500

## 2014-08-17 MED ORDER — COUMADIN BOOK
Freq: Once | Status: DC
  Filled 2014-08-17: qty 1

## 2014-08-17 NOTE — Progress Notes (Signed)
VASCULAR LAB PRELIMINARY  PRELIMINARY  PRELIMINARY  PRELIMINARY  Bilateral lower extremity and left upper extremity venous Dopplers completed.    Preliminary report:  There is no DVT or SVT noted in the bilateral lower extremity veins nor in the left upper extremity. There is sluggish flow noted in the bilateral popliteal veins, etiology unknown.  Michio Thier, RVT 08/17/2014, 11:29 AM

## 2014-08-17 NOTE — Progress Notes (Signed)
ANTICOAGULATION CONSULT NOTE - Follow Up Consult  Pharmacy Consult for Heparin Indication: pulmonary embolus  No Known Allergies  Patient Measurements: Height: 5\' 2"  (157.5 cm) Weight: 150 lb (68.04 kg) IBW/kg (Calculated) : 50.1 Heparin Dosing Weight: 64 kg  Vital Signs: Temp: 100.6 F (38.1 C) (01/14 0035) Temp Source: Oral (01/14 0035) BP: 129/77 mmHg (01/14 0000) Pulse Rate: 111 (01/14 0000)  Labs:  Recent Labs  08/15/14 0250 08/16/14 0540 08/16/14 1225 08/16/14 1930 08/17/14 0131  HGB 10.3* 11.1*  --   --  10.5*  HCT 30.2* 32.0*  --   --  29.0*  PLT 152 195  --   --  201  HEPARINUNFRC  --   --   --  <0.10* 0.20*  CREATININE 2.73* 2.79*  --   --   --   TROPONINI  --   --  0.03 0.06*  --     Estimated Creatinine Clearance: 23.8 mL/min (by C-G formula based on Cr of 2.79).   Medications:  Scheduled:  . piperacillin-tazobactam (ZOSYN)  IV  3.375 g Intravenous Q8H  . vancomycin  750 mg Intravenous Q24H   Infusions:  . dextrose 5 % and 0.45% NaCl 75 mL/hr at 08/17/14 0002  . heparin 1,300 Units/hr (08/16/14 2045)    Assessment: 44 year old female with pulmonary embolus on IV heparin. HL is 0.20 after 4000 unit bolus and rate increase. No bleeding or infusion issues noted.  Goal of Therapy:  Heparin level 0.3-0.7 units/ml Monitor platelets by anticoagulation protocol: Yes   Plan:  Heparin bolus 2500 unit bolus. Increase heparin drip to 1500 units/hr.  Heparin level in 6 hours   Janice CoffinEarl, Patterson Hollenbaugh Jonathan

## 2014-08-17 NOTE — Clinical Documentation Improvement (Signed)
Please specify diagnosis related to below clinical indicators if appropriate.    Possible Clinical Conditions?  Sepsis secondary to E. Coli UTI Severe Sepsis secondary to UTI secondary to E. Coli Bacterial infection of unknown etiology / source Other Condition  Cannot clinically Determine   Clinical Indicators:  Per  1/ 13/16   MD progress notes = septic shock  -Possibly from underlying pyelonephritis. Urine culture growing Escherichia coli which is mostly sensitive. Patient remains tachycardic with heart rate in 120s and borderline blood pressure. She reports some palpitations and dyspnea on exertion. Patient febrile to 100.28F this morning. Continue empiric IV vancomycin and Zosyn. Blood culture from 1/11 without any growth. Has increased leukocytosis today.  -Chest x-ray done today showing right basilar atelectasis/consolidation concerning for underlying pneumonia. Also has moderate bilateral pleural effusions. IV fluids rate &reduced.  -Patient transferred to stepdown unit for closer monitoring.   Thank You, Shelda Palarlene H Dona Walby ,RN Clinical Documentation Specialist:  [phone]  [facility]- [facility] Information Management

## 2014-08-17 NOTE — Progress Notes (Signed)
ANTICOAGULATION CONSULT NOTE - Follow Up Consult  Pharmacy Consult for Heparin Indication: pulmonary embolus  No Known Allergies  Patient Measurements: Height: 5\' 2"  (157.5 cm) Weight: 150 lb (68.04 kg) IBW/kg (Calculated) : 50.1 Heparin Dosing Weight: 64 kg  Vital Signs: Temp: 99.9 F (37.7 C) (01/14 1956) Temp Source: Oral (01/14 1956) BP: 142/81 mmHg (01/14 2000) Pulse Rate: 69 (01/14 2000)  Labs:  Recent Labs  08/15/14 0250 08/16/14 0540 08/16/14 1225  08/16/14 1930 08/17/14 0130 08/17/14 0131 08/17/14 0710 08/17/14 1924  HGB 10.3* 11.1*  --   --   --   --  10.5*  --   --   HCT 30.2* 32.0*  --   --   --   --  29.0*  --   --   PLT 152 195  --   --   --   --  201  --   --   LABPROT  --   --   --   --   --   --   --   --  15.0  INR  --   --   --   --   --   --   --   --  1.17  HEPARINUNFRC  --   --   --   < > <0.10*  --  0.20* 0.29* 0.35  CREATININE 2.73* 2.79*  --   --   --   --  2.41*  --   --   TROPONINI  --   --  0.03  --  0.06* 0.06*  --   --   --   < > = values in this interval not displayed.  Estimated Creatinine Clearance: 27.5 mL/min (by C-G formula based on Cr of 2.41).   Medications:  Scheduled:  . coumadin book   Does not apply Once  . levofloxacin (LEVAQUIN) IV  750 mg Intravenous Q48H  . warfarin   Does not apply Once   Infusions:  . dextrose 5 % and 0.45% NaCl Stopped (08/17/14 1024)  . heparin 1,600 Units/hr (08/17/14 1837)    Assessment: 44 year old female with VQ scan showing intermediate probability of pulmonary embolus on IV heparin.. Patient noted with ARF; SCr noted as and 2.41 was 0.7 four months ago.  Heparin level therapeutic (low end of range) on 1600 units/hr.  INR baseline 1.17, Patient on Levaquin.    Goal of Therapy:  Heparin level 0.3-0.7 units/ml Monitor platelets by anticoagulation protocol: Yes   Plan:  Continue with plan for coumadin 7.5mg  po x1 today.  Increase heparin to 1650 units/hr to keep in goal range.   Follow-up heparin level, PT/INR, and CBC in AM.   Link SnufferJessica Shaneka Efaw, PharmD, BCPS Clinical Pharmacist [phone]  08/17/2014 9:14 PM

## 2014-08-17 NOTE — Progress Notes (Signed)
Pharmacy note: levaquin  44 yo female on zosyn/vancomycin for sepsis/pyelonephritis and cultures show pan sensitive e-coli.  -zosyn d/c and change to levaquin per MD -SCr= 2.41 and CrCl ~ 30  levaquin 1/14>> Vanc 1/11 >> 1/14 Zosyn 1/11 >> 1/14  1/11 BCx - ngtd 1/11 UCx - E coli - pansensitive, except R to Septra 1/11 MRSA PCR neg  Plan -Levaquin 750mg  IV q48hr -Will follow renal function and patient progress -Will cancel vancomycin levels for am (vancomycin has been d/c)  Harland GermanAndrew Taimur Fier, Pharm D 08/17/2014 3:19 PM

## 2014-08-17 NOTE — Progress Notes (Signed)
[facility] ROUNDING NOTE   Subjective:   Interval History: still looks unwell and shallow respirations  Continues on heparin and V/Q was intermediate No contrast due to acute kidney injury  Objective:  Vital signs in last 24 hours:  Temp:  [98.2 F (36.8 C)-101.5 F (38.6 C)] 98.3 F (36.8 C) (01/14 0800) Pulse Rate:  [81-118] 81 (01/14 0800) Resp:  [14-25] 20 (01/14 0800) BP: (119-139)/(69-87) 123/76 mmHg (01/14 0800) SpO2:  [97 %-100 %] 100 % (01/14 0800)  Weight change:  Filed Weights   08/14/14 1123 08/15/14 1924  Weight: 66.6 kg (146 lb 13.2 oz) 68.04 kg (150 lb)    Intake/Output: I/O last 3 completed shifts: In: 1731.2 [P.O.:810; I.V.:671.2; IV Piggyback:250] Out: 1500 [Urine:1500]   Intake/Output this shift:  Total I/O In: 120 [P.O.:120] Out: 1000 [Urine:1000]  CVS- RRR RS- CTA ABD- BS present soft non-distended EXT- no edema   Basic Metabolic Panel:  Recent Labs Lab 08/13/14 2025 08/14/14 0152 08/15/14 0250 08/16/14 0540 08/16/14 1930 08/17/14 0131  NA 133* 132* 141 138  --  142  K 3.2* 3.4* 3.3* 3.0*  --  3.2*  CL 97 103 120* 115*  --  119*  CO2 20 21 12* 16*  --  16*  GLUCOSE 103* 106* 61* 128*  --  120*  BUN 26* 26* 24* 18  --  16  CREATININE 1.89* 2.00* 2.73* 2.79*  --  2.41*  CALCIUM 8.4 7.3* 7.1* 7.5*  --  7.9*  MG  --   --   --   --  2.1  --     Liver Function Tests:  Recent Labs Lab 08/13/14 2025 08/15/14 0930 08/16/14 0540  AST ALT ALKPHOS 84 76 85  BILITOT 1.6* 1.9* 1.2  PROT 6.4 5.0* 4.8*  ALBUMIN 2.7* 1.6* 1.5*    Recent Labs Lab 08/13/14 2025  LIPASE 20   No results for input(s): AMMONIA in the last 168 hours.  CBC:  Recent Labs Lab 08/13/14 2025 08/14/14 0152 08/15/14 0250 08/16/14 0540 08/17/14 0131  WBC 10.6* 10.7* 11.1* 13.7* 12.6*  NEUTROABS 9.5*  --   --   --   --   HGB 12.5 11.2* 10.3* 11.1* 10.5*  HCT 35.4* 31.4* 30.2* 32.0* 29.0*  MCV 82.1 82.6 84.1 82.3  82.6  PLT 143* 123* 152 195 201    Cardiac Enzymes:  Recent Labs Lab 08/16/14 1225 08/16/14 1930 08/17/14 0130  TROPONINI 0.03 0.06* 0.06*    BNP: Invalid input(s): POCBNP  CBG:  Recent Labs Lab 08/14/14 1119  GLUCAP 92    Microbiology: Results for orders placed or performed during the hospital encounter of 08/13/14  Urine culture     Status: None   Collection Time: 08/13/14  9:41 PM  Result Value Ref Range Status   Specimen Description URINE, CLEAN CATCH  Final   Special Requests NONE  Final   Colony Count   Final    >=100,000 COLONIES/ML Performed at [facility]    Culture   Final    ESCHERICHIA COLI Performed at [facility]    Report Status 08/16/2014 FINAL  Final   Organism ID, Bacteria ESCHERICHIA COLI  Final      Susceptibility   Escherichia coli - MIC*    AMPICILLIN >=32 RESISTANT Resistant     CEFAZOLIN <=4 SENSITIVE Sensitive     CEFTRIAXONE <=1 SENSITIVE Sensitive     CIPROFLOXACIN <=0.25 SENSITIVE Sensitive  GENTAMICIN <=1 SENSITIVE Sensitive     LEVOFLOXACIN <=0.12 SENSITIVE Sensitive     NITROFURANTOIN <=16 SENSITIVE Sensitive     TOBRAMYCIN <=1 SENSITIVE Sensitive     TRIMETH/SULFA >=320 RESISTANT Resistant     PIP/TAZO <=4 SENSITIVE Sensitive     * ESCHERICHIA COLI  MRSA PCR Screening     Status: None   Collection Time: 08/14/14 11:22 AM  Result Value Ref Range Status   MRSA by PCR NEGATIVE NEGATIVE Final    Comment:        The GeneXpert MRSA Assay (FDA approved for NASAL specimens only), is one component of a comprehensive MRSA colonization surveillance program. It is not intended to diagnose MRSA infection nor to guide or monitor treatment for MRSA infections.   Culture, blood (routine x 2)     Status: None (Preliminary result)   Collection Time: 08/14/14  2:02 PM  Result Value Ref Range Status   Specimen Description BLOOD RIGHT ANTECUBITAL  Final   Special Requests BOTTLES DRAWN AEROBIC AND ANAEROBIC 10CC   Final   Culture   Final           BLOOD CULTURE RECEIVED NO GROWTH TO DATE CULTURE WILL BE HELD FOR 5 DAYS BEFORE ISSUING A FINAL NEGATIVE REPORT Note: Culture results may be compromised due to an excessive volume of blood received in culture bottles. Performed at [facility]    Report Status PENDING  Incomplete  Culture, blood (routine x 2)     Status: None (Preliminary result)   Collection Time: 08/14/14  2:15 PM  Result Value Ref Range Status   Specimen Description BLOOD RIGHT HAND  Final   Special Requests BOTTLES DRAWN AEROBIC AND ANAEROBIC 10CC  Final   Culture   Final           BLOOD CULTURE RECEIVED NO GROWTH TO DATE CULTURE WILL BE HELD FOR 5 DAYS BEFORE ISSUING A FINAL NEGATIVE REPORT Note: Culture results may be compromised due to an excessive volume of blood received in culture bottles. Performed at [facility]    Report Status PENDING  Incomplete    Coagulation Studies: No results for input(s): LABPROT, INR in the last 72 hours.  Urinalysis: No results for input(s): COLORURINE, LABSPEC, PHURINE, GLUCOSEU, HGBUR, BILIRUBINUR, KETONESUR, PROTEINUR, UROBILINOGEN, NITRITE, LEUKOCYTESUR in the last 72 hours.  Invalid input(s): APPERANCEUR    Imaging: Nm Pulmonary Perf And Vent  08/16/2014   CLINICAL DATA:  Shortness of breath  EXAM: NUCLEAR MEDICINE VENTILATION - PERFUSION LUNG SCAN  TECHNIQUE: Ventilation images were obtained in multiple projections using inhaled aerosol technetium 99 M DTPA. Perfusion images were obtained in multiple projections after intravenous injection of Tc-7438m MAA.  RADIOPHARMACEUTICALS:  40.0 mCi Tc-7838m DTPA aerosol and 6.0 mCi Tc-2538m MAA  COMPARISON:  None.  FINDINGS: Perfusion: There is blunting of the costophrenic angles bilaterally. Moderate size matched right posterior basal perfusion defect. Otherwise no focal ventilation defect.  Ventilation: There is blunting of the costophrenic angles bilaterally. Moderate-sized mass right  posterior basal ventilation defect. Otherwise no wedge shaped peripheral perfusion defects to suggest acute pulmonary embolism. There is slight heterogeneous profusion.  Correlation with chest x-ray which was performed 08/16/2014.  IMPRESSION: 1. Triple match right basilar defect. Intermediate probability for pulmonary embolus (20-79%).   Electronically Signed   By: Elige KoHetal  Patel   On: 08/16/2014 16:41   Dg Chest Port 1 View  08/16/2014   CLINICAL DATA:  44 year old female with respiratory failure.  EXAM: PORTABLE CHEST - 1  VIEW  COMPARISON:  No priors.  FINDINGS: Lung volumes are low. Bibasilar opacities may reflect areas of atelectasis and/or consolidation, with superimposed moderate bilateral pleural effusions. Vascular crowding, likely accentuated by low lung volumes. Heart size is borderline enlarged. Upper mediastinal contours are poorly evaluated secondary to patient's positioning, low lung volumes and portable technique.  IMPRESSION: 1. Bibasilar atelectasis and/or consolidation, concerning for potential sequela of aspiration or underlying pneumonia. Moderate bilateral pleural effusions (right greater than left). 2. Borderline cardiomegaly with pulmonary venous congestion, but no frank pulmonary edema.   Electronically Signed   By: Trudie Reed M.D.   On: 08/16/2014 07:37   US Abdomen Limited Ruq  08/15/2014   CLINICAL DATA:  Upper abdominal pain. Suspected gallbladder wall thickening based on CT scan.  EXAM: US ABDOMEN LIMITED - RIGHT UPPER QUADRANT  COMPARISON:  08/14/2014  FINDINGS: Gallbladder:  Tumefactive sludge in the gallbladder. No gallbladder wall thickening or pericholecystic fluid. Sonographic Murphy's sign absent.  Common bile duct:  Diameter: Mildly dilated at 6 mm, but without directly visualized choledocholithiasis.  Liver:  No focal lesion identified. Within normal limits in parenchymal echogenicity.  Other: Right pleural effusion.  Echogenic right kidney, image 13.  IMPRESSION: 1.  Tumefactive sludge in the gallbladder, but no gallbladder wall thickening, pericholecystic fluid, or sonographic Murphy sign. 2. Mildly dilated CBD, without directly visualized choledocholithiasis. 3. Right pleural effusion. 4. Abnormal echogenic right kidney -differential diagnostic considerations include acute and chronic glomerular nephritis, nephrosclerosis, acute tubular necrosis, chronic pyelonephritis, diabetic nephropathy, and others.   Electronically Signed   By: Herbie Baltimore M.D.   On: 08/15/2014 16:01     Medications:   . dextrose 5 % and 0.45% NaCl 75 mL/hr at 08/17/14 0002  . heparin 1,600 Units/hr (08/17/14 1024)   . piperacillin-tazobactam (ZOSYN)  IV  3.375 g Intravenous Q8H  . potassium chloride  40 mEq Oral Q4H  . vancomycin  750 mg Intravenous Q24H   acetaminophen, oxyCODONE-acetaminophen  Assessment/ Plan:   Acute decompensation. I would be suspicious of pulmonary embolus and agree with V/Q and also empiric use of heparin she has progressive shock and has been transferred to step down unit. She may require pressors and fluids with close monitoring  Acute renal failure  protein/ creatinine ratio 1.6 with the presence of hypoalbuminemia this could be due to Nephrotic syndrome and would make PE and renal vein thrombosis a likely event  Her renal function appears to be improving   I would not give any more vancomycin until a level is checked   Anemia Evaluate and transfuse as directed by primary team  LOS: 4 Zacharee Gaddie W  :25 AM

## 2014-08-17 NOTE — Progress Notes (Addendum)
[facility] PROGRESS NOTE  Megan Cantu UEA:[id number] DOB: 25-Jun-1971 DOA: 08/13/2014 PCP: No PCP Per Patient  History taken using google translator app  Brief narrative 44 year old Hispanic female with no significant past medical history paranasal for pyelonephritis in 2009) presented with abdominal and left flank pain last 4 days. Patient reports taking Tylenol and Motrin without much relief. Patient also reports dark urine and headaches. Patient was found to be febrile in the ED of 102.67F. She was tachycardic and hypotensive with acute kidney injury (creatinine of 2), and hyperdensity without fluid bolus. A CT scan of the abdomen done in the ED showed nonspecific bilateral perinephric stranding possibly secondary to pyelonephritis and thickened gallbladder. Patient admitted to the service but asked her to ICU given the development of septic shock. UA on  admission was suggestive of UTI. She was placed on empiric vancomycin and Zosyn and bolused with IV normal saline. She was placed on when necessary IV morphine for her left lower quadrant pain. Transferred to hospitalist service on 1/ 12 incision care from 1/13 but had  persistent tachycardia and borderline low blood pressure. Non-gap metabolic acidosis and worsened kidney injury. EKG showing sinus tachycardia with mild ST depression in inferior leads and markedly elevated d-dimer. Started on heparin drip and VQ obtained showing intermediate probability. Patient transferred to stepdown unit for closer monitoring.   Assessment/Plan: Septic shock -Possibly from underlying pyelonephritis and possible PNA.Marland Kitchen Urine culture growing Escherichia coli which is mostly sensitive. Tachycardia and low BP improved. Dyspnea improved today. Had tmax of 101.5 last evening but afebrile since. -continue empiric abx. Follow cx -transfer out of stepdown if stable during the day.  Acute PE Patient persistently tachycardic since admission with significantly  elevated d-dimer. VQ scan with intermediate probability for PE. Given clinical schenario and suspicion will treat for acute PE. On IV heparin. Given ATN cannot use lovenox or newer agents. Will start coumadin today. Doppler negative for DVT of LE and LUE. -order hypercoagulable w/up  Elevated troponin No chest pain or changes on telemetry. Likely demand ischemia. Troponin plateud.   Acute tubular necrosis Possibly prerenal due to hypotension and sepsis.  Patient does report taking some Motrin prior to admission. FeNa >1 -low c3, normal C4, , Hbs antibody, HCV antibody and HIVab all negative. Uric acid normal. -avoid nephrotoxins. -Creatinine slightly improved today. Has good UOP -Patient has associated non-anion gap acidosis. Imaging shows possible medical renal disease versus glomerulonephritis. -Renal consult appreciated   Hypokalemia replenishing with  potassium.   Abdominal pain  CT abdomen suggestive of pyelonephritis. No signs of acute cholecystitis on ultrasound abdomen. -Continue with when necessary morphine for pain. - symptoms resolved today.  Left  Arm swelling  noted with some warmth and swelling. Possibly from IV infiltration. Doppler negative for DVT.  Protein calorie malnutrition nutrition consult  Diet: Regular  DVT prophylaxis: Subcutaneous heparin  Code Status: full code Family Communication: none at bedside Disposition Plan: transfer to Tele if stable   Consultants:  PCCM   renal   procedure  None  Antibiotics:  IV vanco and zosyn (1/10--  HPI/Subjective SOB and palpitation resolved. denies any abdominal pain. C/o pain in left arm and elbow  Objective: Filed Vitals:   08/17/14 0800  BP: 123/76  Pulse: 81  Temp: 98.3 F (36.8 C)  Resp: 20    Intake/Output Summary (Last 24 hours) at 08/17/14 1149 Last data filed at 08/17/14 1024  Gross per 24 hour  Intake 2006.21 ml  Output   2000 ml  Net   6.21 ml   Filed Weights   08/14/14  1123 08/15/14 1924  Weight: 66.6 kg (146 lb 13.2 oz) 68.04 kg (150 lb)    Exam:   General: Middle aged female  in no acute distress  HEENT: No pallor, moist oral mucosa  Chest: Clear to auscultation bilaterally,   CVS: S1 and S2 normal,, no murmurs or gallop  Abdomen: Soft, nondistended, non tender,  bowel sounds present  Extremities: Warm, no edema, swelling or tenderness, mild swelling over left arm with some warmth and tenderness  CNS; alert and oriented.  Data Reviewed: Basic Metabolic Panel:  Recent Labs Lab 08/13/14 2025 08/14/14 0152 08/15/14 0250 08/16/14 0540 08/16/14 1930 08/17/14 0131  NA 133* 132* 141 138  --  142  K 3.2* 3.4* 3.3* 3.0*  --  3.2*  CL 97 103 120* 115*  --  119*  CO2 20 21 12* 16*  --  16*  GLUCOSE 103* 106* 61* 128*  --  120*  BUN 26* 26* 24* 18  --  16  CREATININE 1.89* 2.00* 2.73* 2.79*  --  2.41*  CALCIUM 8.4 7.3* 7.1* 7.5*  --  7.9*  MG  --   --   --   --  2.1  --    Liver Function Tests:  Recent Labs Lab 08/13/14 2025 08/15/14 0930 08/16/14 0540  AST ALT ALKPHOS 84 76 85  BILITOT 1.6* 1.9* 1.2  PROT 6.4 5.0* 4.8*  ALBUMIN 2.7* 1.6* 1.5*    Recent Labs Lab 08/13/14 2025  LIPASE 20   No results for input(s): AMMONIA in the last 168 hours. CBC:  Recent Labs Lab 08/13/14 2025 08/14/14 0152 08/15/14 0250 08/16/14 0540 08/17/14 0131  WBC 10.6* 10.7* 11.1* 13.7* 12.6*  NEUTROABS 9.5*  --   --   --   --   HGB 12.5 11.2* 10.3* 11.1* 10.5*  HCT 35.4* 31.4* 30.2* 32.0* 29.0*  MCV 82.1 82.6 84.1 82.3 82.6  PLT 143* 123* 152 195 201   Cardiac Enzymes:  Recent Labs Lab 08/16/14 1225 08/16/14 1930 08/17/14 0130  TROPONINI 0.03 0.06* 0.06*   BNP (last 3 results) No results for input(s): PROBNP in the last 8760 hours. CBG:  Recent Labs Lab 08/14/14 1119  GLUCAP 92    Recent Results (from the past 240 hour(s))  Urine culture     Status: None   Collection Time: 08/13/14  9:41 PM   Result Value Ref Range Status   Specimen Description URINE, CLEAN CATCH  Final   Special Requests NONE  Final   Colony Count   Final    >=100,000 COLONIES/ML Performed at [facility]    Culture   Final    ESCHERICHIA COLI Performed at [facility]    Report Status 08/16/2014 FINAL  Final   Organism ID, Bacteria ESCHERICHIA COLI  Final      Susceptibility   Escherichia coli - MIC*    AMPICILLIN >=32 RESISTANT Resistant     CEFAZOLIN <=4 SENSITIVE Sensitive     CEFTRIAXONE <=1 SENSITIVE Sensitive     CIPROFLOXACIN <=0.25 SENSITIVE Sensitive     GENTAMICIN <=1 SENSITIVE Sensitive     LEVOFLOXACIN <=0.12 SENSITIVE Sensitive     NITROFURANTOIN <=16 SENSITIVE Sensitive     TOBRAMYCIN <=1 SENSITIVE Sensitive     TRIMETH/SULFA >=320 RESISTANT Resistant     PIP/TAZO <=4 SENSITIVE Sensitive     * ESCHERICHIA COLI  MRSA  PCR Screening     Status: None   Collection Time: 08/14/14 11:22 AM  Result Value Ref Range Status   MRSA by PCR NEGATIVE NEGATIVE Final    Comment:        The GeneXpert MRSA Assay (FDA approved for NASAL specimens only), is one component of a comprehensive MRSA colonization surveillance program. It is not intended to diagnose MRSA infection nor to guide or monitor treatment for MRSA infections.   Culture, blood (routine x 2)     Status: None (Preliminary result)   Collection Time: 08/14/14  2:02 PM  Result Value Ref Range Status   Specimen Description BLOOD RIGHT ANTECUBITAL  Final   Special Requests BOTTLES DRAWN AEROBIC AND ANAEROBIC 10CC  Final   Culture   Final           BLOOD CULTURE RECEIVED NO GROWTH TO DATE CULTURE WILL BE HELD FOR 5 DAYS BEFORE ISSUING A FINAL NEGATIVE REPORT Note: Culture results may be compromised due to an excessive volume of blood received in culture bottles. Performed at [facility]    Report Status PENDING  Incomplete  Culture, blood (routine x 2)     Status: None (Preliminary result)   Collection  Time: 08/14/14  2:15 PM  Result Value Ref Range Status   Specimen Description BLOOD RIGHT HAND  Final   Special Requests BOTTLES DRAWN AEROBIC AND ANAEROBIC 10CC  Final   Culture   Final           BLOOD CULTURE RECEIVED NO GROWTH TO DATE CULTURE WILL BE HELD FOR 5 DAYS BEFORE ISSUING A FINAL NEGATIVE REPORT Note: Culture results may be compromised due to an excessive volume of blood received in culture bottles. Performed at [facility]    Report Status PENDING  Incomplete     Studies: Nm Pulmonary Perf And Vent  08/16/2014   CLINICAL DATA:  Shortness of breath  EXAM: NUCLEAR MEDICINE VENTILATION - PERFUSION LUNG SCAN  TECHNIQUE: Ventilation images were obtained in multiple projections using inhaled aerosol technetium 99 M DTPA. Perfusion images were obtained in multiple projections after intravenous injection of Tc-64m MAA.  RADIOPHARMACEUTICALS:  40.0 mCi Tc-42m DTPA aerosol and 6.0 mCi Tc-6m MAA  COMPARISON:  None.  FINDINGS: Perfusion: There is blunting of the costophrenic angles bilaterally. Moderate size matched right posterior basal perfusion defect. Otherwise no focal ventilation defect.  Ventilation: There is blunting of the costophrenic angles bilaterally. Moderate-sized mass right posterior basal ventilation defect. Otherwise no wedge shaped peripheral perfusion defects to suggest acute pulmonary embolism. There is slight heterogeneous profusion.  Correlation with chest x-ray which was performed 08/16/2014.  IMPRESSION: 1. Triple match right basilar defect. Intermediate probability for pulmonary embolus (20-79%).   Electronically Signed   By: Elige Ko   On: 08/16/2014 16:41   Dg Chest Port 1 View  08/16/2014   CLINICAL DATA:  44 year old female with respiratory failure.  EXAM: PORTABLE CHEST - 1 VIEW  COMPARISON:  No priors.  FINDINGS: Lung volumes are low. Bibasilar opacities may reflect areas of atelectasis and/or consolidation, with superimposed moderate bilateral pleural  effusions. Vascular crowding, likely accentuated by low lung volumes. Heart size is borderline enlarged. Upper mediastinal contours are poorly evaluated secondary to patient's positioning, low lung volumes and portable technique.  IMPRESSION: 1. Bibasilar atelectasis and/or consolidation, concerning for potential sequela of aspiration or underlying pneumonia. Moderate bilateral pleural effusions (right greater than left). 2. Borderline cardiomegaly with pulmonary venous congestion, but no frank pulmonary edema.  Electronically Signed   By: Trudie Reedaniel  Entrikin M.D.   On: 08/16/2014 07:37   [place] Abdomen Limited Ruq  08/15/2014   CLINICAL DATA:  Upper abdominal pain. Suspected gallbladder wall thickening based on CT scan.  EXAM: US ABDOMEN LIMITED - RIGHT UPPER QUADRANT  COMPARISON:  08/14/2014  FINDINGS: Gallbladder:  Tumefactive sludge in the gallbladder. No gallbladder wall thickening or pericholecystic fluid. Sonographic Murphy's sign absent.  Common bile duct:  Diameter: Mildly dilated at 6 mm, but without directly visualized choledocholithiasis.  Liver:  No focal lesion identified. Within normal limits in parenchymal echogenicity.  Other: Right pleural effusion.  Echogenic right kidney, image 13.  IMPRESSION: 1. Tumefactive sludge in the gallbladder, but no gallbladder wall thickening, pericholecystic fluid, or sonographic Murphy sign. 2. Mildly dilated CBD, without directly visualized choledocholithiasis. 3. Right pleural effusion. 4. Abnormal echogenic right kidney -differential diagnostic considerations include acute and chronic glomerular nephritis, nephrosclerosis, acute tubular necrosis, chronic pyelonephritis, diabetic nephropathy, and others.   Electronically Signed   By: Herbie BaltimoreWalt  Liebkemann M.D.   On: 08/15/2014 16:01    Scheduled Meds: . piperacillin-tazobactam (ZOSYN)  IV  3.375 g Intravenous Q8H  . potassium chloride  40 mEq Oral Q4H   Continuous Infusions: . dextrose 5 % and 0.45% NaCl Stopped  (08/17/14 1024)  . heparin 1,600 Units/hr (08/17/14 1024)      Time spent: 35 minutes    Lashala Laser  [facility] Pager [phone] If 7PM-7AM, please contact night-coverage at [email or web address], password [email or web address] 08/17/2014, 11:49 AM  LOS: 4 days

## 2014-08-17 NOTE — Progress Notes (Addendum)
ANTICOAGULATION CONSULT NOTE - Follow Up Consult  Pharmacy Consult for Heparin Indication: pulmonary embolus  No Known Allergies  Patient Measurements: Height: 5\' 2"  (157.5 cm) Weight: 150 lb (68.04 kg) IBW/kg (Calculated) : 50.1 Heparin Dosing Weight: 64 kg  Vital Signs: Temp: 98.3 F (36.8 C) (01/14 0800) Temp Source: Oral (01/14 0800) BP: 123/76 mmHg (01/14 0800) Pulse Rate: 81 (01/14 0800)  Labs:  Recent Labs  08/15/14 0250 08/16/14 0540 08/16/14 1225 08/16/14 1930 08/17/14 0130 08/17/14 0131 08/17/14 0710  HGB 10.3* 11.1*  --   --   --  10.5*  --   HCT 30.2* 32.0*  --   --   --  29.0*  --   PLT 152 195  --   --   --  201  --   HEPARINUNFRC  --   --   --  <0.10*  --  0.20* 0.29*  CREATININE 2.73* 2.79*  --   --   --  2.41*  --   TROPONINI  --   --  0.03 0.06* 0.06*  --   --     Estimated Creatinine Clearance: 27.5 mL/min (by C-G formula based on Cr of 2.41).   Medications:  Scheduled:  . piperacillin-tazobactam (ZOSYN)  IV  3.375 g Intravenous Q8H  . potassium chloride  40 mEq Oral Q4H  . vancomycin  750 mg Intravenous Q24H   Infusions:  . dextrose 5 % and 0.45% NaCl 75 mL/hr at 08/17/14 0002  . heparin 1,500 Units/hr (08/17/14 0250)    Assessment: 44 year old female with VQ scan showing intermediate probability of pulmonary embolus on IV heparin and heparin level is just below goal (HL= 0.29). Patient noted with ARF; SCr noted as and 2.41 was 0.7 four months ago.  Goal of Therapy:  Heparin level 0.3-0.7 units/ml Monitor platelets by anticoagulation protocol: Yes   Plan:  -Increase heparin to 1600 units/hr -Heparin level in 8 hrs and daily with CBC daily -Will follow anticoagulations plans  -PT/INR in am for as baseline  Harland Germanndrew Stephenie Navejas, Pharm D 08/17/2014 9:30 AM   Addendum: coumadin -beginning coumadin for PE  Plan -PT/INR today -coumadin 7.5mg  po today (as long as baseline INR is WNL) -Daily PT/INR -Will provide patient  education  Harland Germanndrew Blase Beckner, Pharm D 08/17/2014 12:50 PM

## 2014-08-18 ENCOUNTER — Encounter (HOSPITAL_COMMUNITY): Payer: Self-pay | Admitting: General Practice

## 2014-08-18 LAB — BASIC METABOLIC PANEL
Anion gap: 8 (ref 5–15)
BUN: 16 mg/dL (ref 6–23)
CO2: 18 mmol/L — AB (ref 19–32)
CREATININE: 1.9 mg/dL — AB (ref 0.50–1.10)
Calcium: 7.9 mg/dL — ABNORMAL LOW (ref 8.4–10.5)
Chloride: 114 mEq/L — ABNORMAL HIGH (ref 96–112)
GFR calc Af Amer: 37 mL/min — ABNORMAL LOW (ref >90)
GFR, EST NON AFRICAN AMERICAN: 32 mL/min — AB (ref >90)
GLUCOSE: 93 mg/dL (ref 70–99)
POTASSIUM: 3.6 mmol/L (ref 3.5–5.1)
Sodium: 140 mmol/L (ref 135–145)

## 2014-08-18 LAB — HEPARIN LEVEL (UNFRACTIONATED): HEPARIN UNFRACTIONATED: 0.31 [IU]/mL (ref 0.30–0.70)

## 2014-08-18 LAB — CBC
HCT: 34.7 % — ABNORMAL LOW (ref 36.0–46.0)
HEMOGLOBIN: 12.1 g/dL (ref 12.0–15.0)
MCH: 28.9 pg (ref 26.0–34.0)
MCHC: 34.9 g/dL (ref 30.0–36.0)
MCV: 83 fL (ref 78.0–100.0)
Platelets: 223 10*3/uL (ref 150–400)
RBC: 4.18 MIL/uL (ref 3.87–5.11)
RDW: 14.7 % (ref 11.5–15.5)
WBC: 11.4 10*3/uL — AB (ref 4.0–10.5)

## 2014-08-18 LAB — LUPUS ANTICOAGULANT PANEL
DRVVT: 39.2 s (ref <42.9)
LUPUS ANTICOAGULANT: DETECTED — AB
PTT Lupus Anticoagulant: 63.4 secs — ABNORMAL HIGH (ref 28.0–43.0)
PTTLA 41 MIX: 60.5 s — AB (ref 28.0–43.0)
PTTLA Confirmation: 13.1 secs — ABNORMAL HIGH (ref <8.0)

## 2014-08-18 LAB — PROTIME-INR
INR: 1.21 (ref 0.00–1.49)
PROTHROMBIN TIME: 15.4 s — AB (ref 11.6–15.2)

## 2014-08-18 LAB — HOMOCYSTEINE: HOMOCYSTEINE-NORM: 4.8 umol/L (ref 0.0–15.0)

## 2014-08-18 MED ORDER — WARFARIN SODIUM 7.5 MG PO TABS
7.5000 mg | ORAL_TABLET | Freq: Once | ORAL | Status: AC
  Administered 2014-08-18: 7.5 mg via ORAL
  Filled 2014-08-18: qty 1

## 2014-08-18 MED ORDER — HEPARIN (PORCINE) IN NACL 100-0.45 UNIT/ML-% IJ SOLN
1400.0000 [IU]/h | INTRAMUSCULAR | Status: DC
  Administered 2014-08-19: 1700 [IU]/h via INTRAVENOUS
  Administered 2014-08-19: 1600 [IU]/h via INTRAVENOUS
  Administered 2014-08-20 – 2014-08-21 (×2): 1400 [IU]/h via INTRAVENOUS
  Filled 2014-08-18 (×6): qty 250

## 2014-08-18 MED ORDER — POTASSIUM CHLORIDE CRYS ER 20 MEQ PO TBCR
40.0000 meq | EXTENDED_RELEASE_TABLET | Freq: Once | ORAL | Status: AC
  Administered 2014-08-18: 40 meq via ORAL
  Filled 2014-08-18: qty 2

## 2014-08-18 MED ORDER — BOOST / RESOURCE BREEZE PO LIQD
1.0000 | Freq: Two times a day (BID) | ORAL | Status: DC
  Administered 2014-08-18 – 2014-08-21 (×6): 1 via ORAL

## 2014-08-18 NOTE — Progress Notes (Signed)
[facility] PROGRESS NOTE  Megan Cantu WUJ:[id number] DOB: May 30, 1971 DOA: 08/13/2014 PCP: No PCP Per Patient  History taken using google translator app  Brief narrative 44 year old Hispanic female with no significant past medical history paranasal for pyelonephritis in 2009) presented with abdominal and left flank pain last 4 days. Patient reports taking Tylenol and Motrin without much relief. Patient also reports dark urine and headaches. Patient was found to be febrile in the ED of 102.21F. She was tachycardic and hypotensive with acute kidney injury (creatinine of 2), and hyperdensity without fluid bolus. A CT scan of the abdomen done in the ED showed nonspecific bilateral perinephric stranding possibly secondary to pyelonephritis and thickened gallbladder. Patient admitted to the service but asked her to ICU given the development of septic shock. UA on  admission was suggestive of UTI. She was placed on empiric vancomycin and Zosyn and bolused with IV normal saline. She was placed on when necessary IV morphine for her left lower quadrant pain. Transferred to hospitalist service on 1/ 12 incision care from 1/13 but had  persistent tachycardia and borderline low blood pressure. Non-gap metabolic acidosis and worsened kidney injury. EKG showing sinus tachycardia with mild ST depression in inferior leads and markedly elevated d-dimer. Started on heparin drip and VQ obtained showing intermediate probability. Patient transferred to stepdown unit for closer monitoring.   Assessment/Plan: Septic shock (resolved) -Possibly from underlying pyelonephritis and possible PNA.Marland Kitchen Urine culture growing Escherichia coli which is mostly sensitive. -Patient afebrile. Tachycardia and hypotension resolved. -New empiric Levaquin. Transfer to MedSurg.  Acute PE -Elevated d-dimer with intermediate probability for PE on VQ scan. Started on IV heparin and Coumadin. Doppler negative for DVT of LE and  LUE. -ordered hypercoagulable w/up -Initially stable at present. Will treat for health 6 months of anticoagulation with Coumadin.  Elevated troponin No chest pain or changes on telemetry. Likely demand ischemia. Troponin plateud. Echo normal.  Acute tubular necrosis Possibly prerenal due to hypotension and sepsis.  Patient does report taking some Motrin prior to admission. FeNa >1 -low c3, normal C4, , Hbs antibody, HCV antibody and HIVab all negative. Uric acid normal. -Creatinine slowly improving. -Renal consult appreciated   Hypokalemia Replenished   Abdominal pain  CT abdomen suggestive of pyelonephritis. No signs of acute cholecystitis on ultrasound abdomen. -Continue with when necessary morphine for pain. - symptoms resolved   Left  Arm swelling Possibly from IV infiltration. Doppler negative for DVT. resolved  Protein calorie malnutrition nutrition consult  Diet: Regular  DVT prophylaxis: Subcutaneous heparin  Code Status: full code Family Communication: none at bedside Disposition Plan: transfer to Tmedsurg. Home once iNR therapeutic. Likely early next week.    Consultants:  PCCM   renal   procedure  None  Antibiotics:  IV vanco and zosyn (1/10--  HPI/Subjective Denies any symptoms today. No further pain and swelling in her left arm. Stable on stepdown overnight  Objective: Filed Vitals:   08/18/14 1336  BP: 131/78  Pulse: 72  Temp: 98.3 F (36.8 C)  Resp: 18    Intake/Output Summary (Last 24 hours) at 08/18/14 1501 Last data filed at 08/18/14 1200  Gross per 24 hour  Intake 933.18 ml  Output   2700 ml  Net -1766.82 ml   Filed Weights   08/14/14 1123 08/15/14 1924  Weight: 66.6 kg (146 lb 13.2 oz) 68.04 kg (150 lb)    Exam:   General: no acute distress  HEENT: , moist oral mucosa  Chest: Clear to auscultation bilaterally,  CVS: S1 and S2 normal,, no murmurs or gallop  Abdomen: Soft, nondistended, non tender,  bowel  sounds present  Extremities: Warm, no edema,   CNS; alert and oriented.  Data Reviewed: Basic Metabolic Panel:  Recent Labs Lab 08/14/14 0152 08/15/14 0250 08/16/14 0540 08/16/14 1930 08/17/14 0131 08/18/14 0325  NA 132* 141 138  --  142 140  K 3.4* 3.3* 3.0*  --  3.2* 3.6  CL 103 120* 115*  --  119* 114*  CO2 21 12* 16*  --  16* 18*  GLUCOSE 106* 61* 128*  --  120* 93  BUN 26* 24* 18  --  16 16  CREATININE 2.00* 2.73* 2.79*  --  2.41* 1.90*  CALCIUM 7.3* 7.1* 7.5*  --  7.9* 7.9*  MG  --   --   --  2.1  --   --    Liver Function Tests:  Recent Labs Lab 08/13/14 2025 08/15/14 0930 08/16/14 0540  AST 20 20 21   ALT 14 13 12   ALKPHOS 84 76 85  BILITOT 1.6* 1.9* 1.2  PROT 6.4 5.0* 4.8*  ALBUMIN 2.7* 1.6* 1.5*    Recent Labs Lab 08/13/14 2025  LIPASE 20   No results for input(s): AMMONIA in the last 168 hours. CBC:  Recent Labs Lab 08/13/14 2025 08/14/14 0152 08/15/14 0250 08/16/14 0540 08/17/14 0131 08/18/14 0802  WBC 10.6* 10.7* 11.1* 13.7* 12.6* 11.4*  NEUTROABS 9.5*  --   --   --   --   --   HGB 12.5 11.2* 10.3* 11.1* 10.5* 12.1  HCT 35.4* 31.4* 30.2* 32.0* 29.0* 34.7*  MCV 82.1 82.6 84.1 82.3 82.6 83.0  PLT 143* 123* 152 195 201 223   Cardiac Enzymes:  Recent Labs Lab 08/16/14 1225 08/16/14 1930 08/17/14 0130  TROPONINI 0.03 0.06* 0.06*   BNP (last 3 results) No results for input(s): PROBNP in the last 8760 hours. CBG:  Recent Labs Lab 08/14/14 1119  GLUCAP 92    Recent Results (from the past 240 hour(s))  Urine culture     Status: None   Collection Time: 08/13/14  9:41 PM  Result Value Ref Range Status   Specimen Description URINE, CLEAN CATCH  Final   Special Requests NONE  Final   Colony Count   Final    >=100,000 COLONIES/ML [facility]    Culture   Final    ESCHERICHIA COLI [facility]    Report Status 08/16/2014 FINAL  Final   Organism ID, Bacteria ESCHERICHIA COLI   Final      Susceptibility   Escherichia coli - MIC*    AMPICILLIN >=32 RESISTANT Resistant     CEFAZOLIN <=4 SENSITIVE Sensitive     CEFTRIAXONE <=1 SENSITIVE Sensitive     CIPROFLOXACIN <=0.25 SENSITIVE Sensitive     GENTAMICIN <=1 SENSITIVE Sensitive     LEVOFLOXACIN <=0.12 SENSITIVE Sensitive     NITROFURANTOIN <=16 SENSITIVE Sensitive     TOBRAMYCIN <=1 SENSITIVE Sensitive     TRIMETH/SULFA >=320 RESISTANT Resistant     PIP/TAZO <=4 SENSITIVE Sensitive     * ESCHERICHIA COLI  MRSA PCR Screening     Status: None   Collection Time: 08/14/14 11:22 AM  Result Value Ref Range Status   MRSA by PCR NEGATIVE NEGATIVE Final    Comment:        The GeneXpert MRSA Assay (FDA approved for NASAL specimens only), is one component of a comprehensive MRSA colonization surveillance  program. It is not intended to diagnose MRSA infection nor to guide or monitor treatment for MRSA infections.   Culture, blood (routine x 2)     Status: None (Preliminary result)   Collection Time: 08/14/14  2:02 PM  Result Value Ref Range Status   Specimen Description BLOOD RIGHT ANTECUBITAL  Final   Special Requests BOTTLES DRAWN AEROBIC AND ANAEROBIC 10CC  Final   Culture   Final           BLOOD CULTURE RECEIVED NO GROWTH TO DATE CULTURE WILL BE HELD FOR 5 DAYS BEFORE ISSUING A FINAL NEGATIVE REPORT Note: Culture results may be compromised due to an excessive volume of blood received in culture bottles. Performed at [facility]    Report Status PENDING  Incomplete  Culture, blood (routine x 2)     Status: None (Preliminary result)   Collection Time: 08/14/14  2:15 PM  Result Value Ref Range Status   Specimen Description BLOOD RIGHT HAND  Final   Special Requests BOTTLES DRAWN AEROBIC AND ANAEROBIC 10CC  Final   Culture   Final           BLOOD CULTURE RECEIVED NO GROWTH TO DATE CULTURE WILL BE HELD FOR 5 DAYS BEFORE ISSUING A FINAL NEGATIVE REPORT Note: Culture results may be compromised due to  an excessive volume of blood received in culture bottles. Performed at [facility]    Report Status PENDING  Incomplete     Studies: Nm Pulmonary Perf And Vent  08/16/2014   CLINICAL DATA:  Shortness of breath  EXAM: NUCLEAR MEDICINE VENTILATION - PERFUSION LUNG SCAN  TECHNIQUE: Ventilation images were obtained in multiple projections using inhaled aerosol technetium 99 M DTPA. Perfusion images were obtained in multiple projections after intravenous injection of Tc-4m MAA.  RADIOPHARMACEUTICALS:  40.0 mCi Tc-35m DTPA aerosol and 6.0 mCi Tc-25m MAA  COMPARISON:  None.  FINDINGS: Perfusion: There is blunting of the costophrenic angles bilaterally. Moderate size matched right posterior basal perfusion defect. Otherwise no focal ventilation defect.  Ventilation: There is blunting of the costophrenic angles bilaterally. Moderate-sized mass right posterior basal ventilation defect. Otherwise no wedge shaped peripheral perfusion defects to suggest acute pulmonary embolism. There is slight heterogeneous profusion.  Correlation with chest x-ray which was performed 08/16/2014.  IMPRESSION: 1. Triple match right basilar defect. Intermediate probability for pulmonary embolus (20-79%).   Electronically Signed   By: Elige Ko   On: 08/16/2014 16:41    Scheduled Meds: . coumadin book   Does not apply Once  . feeding supplement (RESOURCE BREEZE)  1 Container Oral BID BM  . levofloxacin (LEVAQUIN) IV  750 mg Intravenous Q48H  . warfarin  7.5 mg Oral ONCE-1800  . Warfarin - Pharmacist Dosing Inpatient   Does not apply q1800   Continuous Infusions: . dextrose 5 % and 0.45% NaCl Stopped (08/17/14 1024)  . heparin 1,700 Units/hr (08/18/14 1337)      Time spent: 35 minutes    Berkley Wrightsman  [facility] Pager [phone] If 7PM-7AM, please contact night-coverage at [email or web address], password [facility] 08/18/2014, 3:01 PM  LOS: 5 days

## 2014-08-18 NOTE — Progress Notes (Signed)
NURSING PROGRESS NOTE  Megan Cantu [id number] Transfer Data: 08/18/2014 1:43 PM Attending Provider: Eddie NorthNishant Dhungel, MD PCP:No PCP Per Patient Code Status: FULL  Megan Cantu is a 44 y.o. female patient transferred from [facility]  -No acute distress noted.  -No complaints of shortness of breath.  -No complaints of chest pain.     Blood pressure 131/78, pulse 72, temperature 98.3 F (36.8 C), temperature source Oral, resp. rate 18, height 5\' 2"  (1.575 m), weight 68.04 kg (150 lb), last menstrual period 08/08/2014, SpO2 100 %.   IV Fluids:  IV in place, occlusive dsg intact without redness, IV cath, two IVs located on right arm, running heparin ([facility]  Allergies:  Review of patient's allergies indicates no known allergies.  Past Medical History:   has no past medical history on file.  Past Surgical History:   has no past surgical history on file.  Social History:   reports that she has never smoked. She does not have any smokeless tobacco history on file. She reports that she does not drink alcohol or use illicit drugs.  Skin: Intact  Patient/Family orientated to room. Information packet given to patient/family. Admission inpatient armband information verified with patient/family to include name and date of birth and placed on patient arm. Side rails up x 2, fall assessment and education completed with patient/family. Patient/family able to verbalize understanding of risk associated with falls and verbalized understanding to call for assistance before getting out of bed. Call light within reach. Patient/family able to voice and demonstrate understanding of unit orientation instructions.    Will continue to evaluate and treat per MD orders.

## 2014-08-18 NOTE — Progress Notes (Signed)
[facility] ROUNDING NOTE   Subjective:   Interval History:  Much improved feels better  Objective:  Vital signs in last 24 hours:  Temp:  [98.2 F (36.8 C)-99.9 F (37.7 C)] 98.2 F (36.8 C) (01/15 1155) Pulse Rate:  [62-80] 62 (01/15 0700) Resp:  [22-28] 22 (01/15 0700) BP: (126-145)/(57-81) 142/81 mmHg (01/15 1155) SpO2:  [96 %-99 %] 96 % (01/15 0700)  Weight change:  Filed Weights   08/14/14 1123 08/15/14 1924  Weight: 66.6 kg (146 lb 13.2 oz) 68.04 kg (150 lb)    Intake/Output: I/O last 3 completed shifts: In: 2886.9 [P.O.:1250; I.V.:1386.9; IV Piggyback:250] Out: 4300 [Urine:4300]   Intake/Output this shift:  Total I/O In: 169.5 [P.O.:120; I.V.:49.5] Out: -   CVS- RRR RS- CTA ABD- BS present soft non-distended EXT- no edema   Basic Metabolic Panel:  Recent Labs Lab 08/14/14 0152 08/15/14 0250 08/16/14 0540 08/16/14 1930 08/17/14 0131 08/18/14 0325  NA 132* 141 138  --  142 140  K 3.4* 3.3* 3.0*  --  3.2* 3.6  CL 103 120* 115*  --  119* 114*  CO2 21 12* 16*  --  16* 18*  GLUCOSE 106* 61* 128*  --  120* 93  BUN 26* 24* 18  --  16 16  CREATININE 2.00* 2.73* 2.79*  --  2.41* 1.90*  CALCIUM 7.3* 7.1* 7.5*  --  7.9* 7.9*  MG  --   --   --  2.1  --   --     Liver Function Tests:  Recent Labs Lab 08/13/14 2025 08/15/14 0930 08/16/14 0540  AST ALT ALKPHOS 84 76 85  BILITOT 1.6* 1.9* 1.2  PROT 6.4 5.0* 4.8*  ALBUMIN 2.7* 1.6* 1.5*    Recent Labs Lab 08/13/14 2025  LIPASE 20   No results for input(s): AMMONIA in the last 168 hours.  CBC:  Recent Labs Lab 08/13/14 2025 08/14/14 0152 08/15/14 0250 08/16/14 0540 08/17/14 0131 08/18/14 0802  WBC 10.6* 10.7* 11.1* 13.7* 12.6* 11.4*  NEUTROABS 9.5*  --   --   --   --   --   HGB 12.5 11.2* 10.3* 11.1* 10.5* 12.1  HCT 35.4* 31.4* 30.2* 32.0* 29.0* 34.7*  MCV 82.1 82.6 84.1 82.3 82.6 83.0  PLT 143* 123* 152 195 201 223    Cardiac  Enzymes:  Recent Labs Lab 08/16/14 1225 08/16/14 1930 08/17/14 0130  TROPONINI 0.03 0.06* 0.06*    BNP: Invalid input(s): POCBNP  CBG:  Recent Labs Lab 08/14/14 1119  GLUCAP 92    Microbiology: Results for orders placed or performed during the hospital encounter of 08/13/14  Urine culture     Status: None   Collection Time: 08/13/14  9:41 PM  Result Value Ref Range Status   Specimen Description URINE, CLEAN CATCH  Final   Special Requests NONE  Final   Colony Count   Final    >=100,000 COLONIES/ML Performed at [facility]    Culture   Final    ESCHERICHIA COLI Performed at [facility]    Report Status 08/16/2014 FINAL  Final   Organism ID, Bacteria ESCHERICHIA COLI  Final      Susceptibility   Escherichia coli - MIC*    AMPICILLIN >=32 RESISTANT Resistant     CEFAZOLIN <=4 SENSITIVE Sensitive     CEFTRIAXONE <=1 SENSITIVE Sensitive     CIPROFLOXACIN <=0.25 SENSITIVE Sensitive     GENTAMICIN <=1 SENSITIVE  Sensitive     LEVOFLOXACIN <=0.12 SENSITIVE Sensitive     NITROFURANTOIN <=16 SENSITIVE Sensitive     TOBRAMYCIN <=1 SENSITIVE Sensitive     TRIMETH/SULFA >=320 RESISTANT Resistant     PIP/TAZO <=4 SENSITIVE Sensitive     * ESCHERICHIA COLI  MRSA PCR Screening     Status: None   Collection Time: 08/14/14 11:22 AM  Result Value Ref Range Status   MRSA by PCR NEGATIVE NEGATIVE Final    Comment:        The GeneXpert MRSA Assay (FDA approved for NASAL specimens only), is one component of a comprehensive MRSA colonization surveillance program. It is not intended to diagnose MRSA infection nor to guide or monitor treatment for MRSA infections.   Culture, blood (routine x 2)     Status: None (Preliminary result)   Collection Time: 08/14/14  2:02 PM  Result Value Ref Range Status   Specimen Description BLOOD RIGHT ANTECUBITAL  Final   Special Requests BOTTLES DRAWN AEROBIC AND ANAEROBIC 10CC  Final   Culture   Final           BLOOD  CULTURE RECEIVED NO GROWTH TO DATE CULTURE WILL BE HELD FOR 5 DAYS BEFORE ISSUING A FINAL NEGATIVE REPORT Note: Culture results may be compromised due to an excessive volume of blood received in culture bottles. Performed at [facility]    Report Status PENDING  Incomplete  Culture, blood (routine x 2)     Status: None (Preliminary result)   Collection Time: 08/14/14  2:15 PM  Result Value Ref Range Status   Specimen Description BLOOD RIGHT HAND  Final   Special Requests BOTTLES DRAWN AEROBIC AND ANAEROBIC 10CC  Final   Culture   Final           BLOOD CULTURE RECEIVED NO GROWTH TO DATE CULTURE WILL BE HELD FOR 5 DAYS BEFORE ISSUING A FINAL NEGATIVE REPORT Note: Culture results may be compromised due to an excessive volume of blood received in culture bottles. Performed at [facility]    Report Status PENDING  Incomplete    Coagulation Studies:  Recent Labs  08/17/14 1924 08/18/14 0325  LABPROT 15.0 15.4*  INR 1.17 1.21    Urinalysis: No results for input(s): COLORURINE, LABSPEC, PHURINE, GLUCOSEU, HGBUR, BILIRUBINUR, KETONESUR, PROTEINUR, UROBILINOGEN, NITRITE, LEUKOCYTESUR in the last 72 hours.  Invalid input(s): APPERANCEUR    Imaging: Nm Pulmonary Perf And Vent  08/16/2014   CLINICAL DATA:  Shortness of breath  EXAM: NUCLEAR MEDICINE VENTILATION - PERFUSION LUNG SCAN  TECHNIQUE: Ventilation images were obtained in multiple projections using inhaled aerosol technetium 99 M DTPA. Perfusion images were obtained in multiple projections after intravenous injection of Tc-3m MAA.  RADIOPHARMACEUTICALS:  40.0 mCi Tc-57m DTPA aerosol and 6.0 mCi Tc-34m MAA  COMPARISON:  None.  FINDINGS: Perfusion: There is blunting of the costophrenic angles bilaterally. Moderate size matched right posterior basal perfusion defect. Otherwise no focal ventilation defect.  Ventilation: There is blunting of the costophrenic angles bilaterally. Moderate-sized mass right posterior basal  ventilation defect. Otherwise no wedge shaped peripheral perfusion defects to suggest acute pulmonary embolism. There is slight heterogeneous profusion.  Correlation with chest x-ray which was performed 08/16/2014.  IMPRESSION: 1. Triple match right basilar defect. Intermediate probability for pulmonary embolus (20-79%).   Electronically Signed   By: Elige Ko   On: 08/16/2014 16:41     Medications:   . dextrose 5 % and 0.45% NaCl Stopped (08/17/14 1024)  . heparin     .  coumadin book   Does not apply Once  . feeding supplement (RESOURCE BREEZE)  1 Container Oral BID BM  . levofloxacin (LEVAQUIN) IV  750 mg Intravenous Q48H  . warfarin  7.5 mg Oral ONCE-1800  . Warfarin - Pharmacist Dosing Inpatient   Does not apply q1800   acetaminophen, oxyCODONE-acetaminophen  Assessment/ Plan:   Acute decompensation. I would be suspicious of pulmonary embolus and agree with V/Q and also empiric use of heparin she has progressive shock and has been transferred to step down unit. She may require pressors and fluids with close monitoring  Acute renal failure  Her renal function appears to be improving I shall sign off and follow as outpatient  I would not give any more vancomycin until a level is checked   Anemia Evaluate and transfuse as directed by primary team  Sign off today  [phone]   LOS: 5 Megan Cantu W @TODAY @1 :17 PM

## 2014-08-18 NOTE — Progress Notes (Addendum)
ANTICOAGULATION CONSULT NOTE - Follow Up Consult  Pharmacy Consult for Heparin, coumadin Indication: pulmonary embolus  No Known Allergies  Patient Measurements: Height: 5\' 2"  (157.5 cm) Weight: 150 lb (68.04 kg) IBW/kg (Calculated) : 50.1 Heparin Dosing Weight: 64 kg  Vital Signs: Temp: 98.2 F (36.8 C) (01/15 1155) Temp Source: Oral (01/15 1155) BP: 142/81 mmHg (01/15 1155) Pulse Rate: 62 (01/15 0700)  Labs:  Recent Labs  08/16/14 0540 08/16/14 1225  08/16/14 1930 08/17/14 0130 08/17/14 0131 08/17/14 0710 08/17/14 1924 08/18/14 0325 08/18/14 0802  HGB 11.1*  --   --   --   --  10.5*  --   --   --  12.1  HCT 32.0*  --   --   --   --  29.0*  --   --   --  34.7*  PLT 195  --   --   --   --  201  --   --   --  223  LABPROT  --   --   --   --   --   --   --  15.0 15.4*  --   INR  --   --   --   --   --   --   --  1.17 1.21  --   HEPARINUNFRC  --   --   < > <0.10*  --  0.20* 0.29* 0.35 0.31  --   CREATININE 2.79*  --   --   --   --  2.41*  --   --  1.90*  --   TROPONINI  --  0.03  --  0.06* 0.06*  --   --   --   --   --   < > = values in this interval not displayed.  Estimated Creatinine Clearance: 34.9 mL/min (by C-G formula based on Cr of 1.9).   Medications:  Scheduled:  . coumadin book   Does not apply Once  . feeding supplement (RESOURCE BREEZE)  1 Container Oral BID BM  . levofloxacin (LEVAQUIN) IV  750 mg Intravenous Q48H  . Warfarin - Pharmacist Dosing Inpatient   Does not apply q1800   Infusions:  . dextrose 5 % and 0.45% NaCl Stopped (08/17/14 1024)  . heparin 1,650 Units/hr (08/18/14 1131)    Assessment: 44 year old female with VQ scan showing intermediate probability of pulmonary embolus on IV heparin.. Patient noted with ARF; SCr trending down to 1.9 and  was 0.7 four months ago.  Heparin level therapeutic (low end of range) on 1650 units/hr.  INR is 1.21 on day 2 of coumadin/heparin overlap (minimum of 5 days needed). She is also noted on  levaquin.   Goal of Therapy:  Heparin level 0.3-0.7 units/ml Monitor platelets by anticoagulation protocol: Yes   Plan:  -Coumadin 7.5mg  po x1 today.  -Increase heparin to 1700 units/hr to keep in goal range.  -Follow-up heparin level, PT/INR, and CBC in AM.   Harland GermanAndrew Jenay Morici, Pharm D 08/18/2014 12:29 PM

## 2014-08-18 NOTE — Progress Notes (Signed)
INITIAL NUTRITION ASSESSMENT  DOCUMENTATION CODES Per approved criteria  -Not Applicable   INTERVENTION: -Resource Breeze po BID, each supplement provides 250 kcal and 9 grams of protein  NUTRITION DIAGNOSIS: Inadequate oral intake related to decreased appetite as evidenced by 25% meal completion.   Goal: Pt will meet >90% of estimated nutritional needs  Monitor:  PO/supplement intake, labs, weight changes, I/O's  Reason for Assessment: Consult to assess needs  44 y.o. female  Admitting Dx: <principal problem not specified>  Megan Cantu is a 44 y.o. female with no significant PMH who presents to the ED with abdominal pain and flank pain that onset 4 days ago. Pain is located in her left flank and epigastric area. No radiation. Is aching and severe. Gradual onset and progressive since onset. Nothing makes pain better nor worse. Tylenol and motrin have provided no relief. Has dark urine and headache as well. Denies fever subjectively (objectively has Tm of 102.1 in ED), N/V/D, chest pain, dysuria, constipation.  ASSESSMENT: Pt admitted with sepsis, secondary to UTI.  Pt very lethargic at time of visit. No family at bedside. Pt with poor appetite since admission; PO: 25%.  Abbreviated nutrition-focused physical exam showed no signs of depletion on orbital, temple, clavicle, hands, or upper extremity areas. Unable to assess wt hx at this time due to lack of available wt hx. However, nutrition screen reveals no weight loss or poor appetite PTA. Albumin has a half-life of 21 days and is strongly affected by stress response and inflammatory process, therefore, do not expect to see an improvement in this lab value during acute hospitalization. When a patient presents with low albumin, it is likely skewed due to the acute inflammatory response.  Unless it is suspected that patient had poor PO intake or malnutrition prior to admission, then RD should not be consulted solely for low  albumin. Note that low albumin is no longer used to diagnose malnutrition; [facility] uses the new malnutrition guidelines published by the American Society for Parenteral and Enteral Nutrition (A.S.P.E.N.) and the Academy of Nutrition and Dietetics (AND).   Labs reviewed. Cl: 114, CO2: 18, Creat: 1.90, Calcium: 7.9. K and Mg WDL.  Height: Ht Readings from Last 1 Encounters:  08/15/14  (1.575 m)    Weight: Wt Readings from Last 1 Encounters:  08/15/14 150 lb (68.04 kg)    Ideal Body Weight: 110#  % Ideal Body Weight: 136%  Wt Readings from Last 10 Encounters:  08/15/14 150 lb (68.04 kg)    Usual Body Weight: unknown  % Usual Body Weight: unknown  BMI:  Body mass index is 27.43 kg/(m^2). Overweight.  Estimated Nutritional Needs: Kcal: 1700-1900 Protein: 75-85 grams Fluid: 1.7-1.9 L  Skin: WDL  Diet Order: Diet regular  EDUCATION NEEDS: -Education not appropriate at this time   Intake/Output Summary (Last 24 hours) at 08/18/14 0853 Last data filed at 08/18/14 0700  Gross per 24 hour  Intake 1280.68 ml  Output   3200 ml  Net -1919.32 ml    Last BM: 08/17/14  Labs:   Recent Labs Lab 08/16/14 0540 08/16/14 1930 08/17/14 0131 08/18/14 0325  NA 138  --  142 140  K 3.0*  --  3.2* 3.6  CL 115*  --  119* 114*  CO2 16*  --  16* 18*  BUN 18  --  16 16  CREATININE 2.79*  --  2.41* 1.90*  CALCIUM 7.5*  --  7.9* 7.9*  MG  --  2.1  --   --  GLUCOSE 128*  --  120* 93    CBG (last 3)  No results for input(s): GLUCAP in the last 72 hours.  Scheduled Meds: . coumadin book   Does not apply Once  . levofloxacin (LEVAQUIN) IV  750 mg Intravenous Q48H  . potassium chloride  40 mEq Oral Once  . Warfarin - Pharmacist Dosing Inpatient   Does not apply q1800    Continuous Infusions: . dextrose 5 % and 0.45% NaCl Stopped (08/17/14 1024)  . heparin 1,650 Units/hr (08/17/14 2139)    History reviewed. No pertinent past medical history.  History reviewed.  No pertinent past surgical history.  Joslynn Jamroz A. Mayford KnifeWilliams, RD, LDN, CDE Pager: [phone] After hours Pager: [phone]

## 2014-08-19 ENCOUNTER — Inpatient Hospital Stay (HOSPITAL_COMMUNITY): Payer: Medicaid Other

## 2014-08-19 ENCOUNTER — Encounter (HOSPITAL_COMMUNITY): Payer: Self-pay | Admitting: Radiology

## 2014-08-19 DIAGNOSIS — E876 Hypokalemia: Secondary | ICD-10-CM | POA: Diagnosis not present

## 2014-08-19 DIAGNOSIS — A4151 Sepsis due to Escherichia coli [E. coli]: Secondary | ICD-10-CM | POA: Diagnosis present

## 2014-08-19 DIAGNOSIS — R760 Raised antibody titer: Secondary | ICD-10-CM | POA: Diagnosis present

## 2014-08-19 DIAGNOSIS — R6521 Severe sepsis with septic shock: Secondary | ICD-10-CM

## 2014-08-19 DIAGNOSIS — R790 Abnormal level of blood mineral: Secondary | ICD-10-CM

## 2014-08-19 LAB — PROTEIN C, TOTAL: PROTEIN C, TOTAL: 56 % — AB (ref 70–140)

## 2014-08-19 LAB — CBC
HEMATOCRIT: 31.3 % — AB (ref 36.0–46.0)
HEMOGLOBIN: 10.8 g/dL — AB (ref 12.0–15.0)
MCH: 28.2 pg (ref 26.0–34.0)
MCHC: 34.5 g/dL (ref 30.0–36.0)
MCV: 81.7 fL (ref 78.0–100.0)
PLATELETS: 236 10*3/uL (ref 150–400)
RBC: 3.83 MIL/uL — ABNORMAL LOW (ref 3.87–5.11)
RDW: 14.1 % (ref 11.5–15.5)
WBC: 11.4 10*3/uL — AB (ref 4.0–10.5)

## 2014-08-19 LAB — PROTIME-INR
INR: 2.24 — AB (ref 0.00–1.49)
Prothrombin Time: 25 seconds — ABNORMAL HIGH (ref 11.6–15.2)

## 2014-08-19 LAB — HEPARIN LEVEL (UNFRACTIONATED)
HEPARIN UNFRACTIONATED: 0.78 [IU]/mL — AB (ref 0.30–0.70)
Heparin Unfractionated: 0.71 IU/mL — ABNORMAL HIGH (ref 0.30–0.70)
Heparin Unfractionated: 0.8 IU/mL — ABNORMAL HIGH (ref 0.30–0.70)

## 2014-08-19 LAB — BASIC METABOLIC PANEL
Anion gap: 5 (ref 5–15)
BUN: 15 mg/dL (ref 6–23)
CALCIUM: 7.9 mg/dL — AB (ref 8.4–10.5)
CO2: 25 mmol/L (ref 19–32)
CREATININE: 1.61 mg/dL — AB (ref 0.50–1.10)
Chloride: 112 mEq/L (ref 96–112)
GFR calc Af Amer: 45 mL/min — ABNORMAL LOW (ref >90)
GFR calc non Af Amer: 39 mL/min — ABNORMAL LOW (ref >90)
GLUCOSE: 113 mg/dL — AB (ref 70–99)
Potassium: 2.8 mmol/L — ABNORMAL LOW (ref 3.5–5.1)
SODIUM: 142 mmol/L (ref 135–145)

## 2014-08-19 LAB — PROTEIN S, TOTAL: Protein S Ag, Total: 104 % (ref 58–150)

## 2014-08-19 LAB — PROTEIN S ACTIVITY: Protein S Activity: 26 % — ABNORMAL LOW (ref 60–145)

## 2014-08-19 LAB — PROTEIN C ACTIVITY: Protein C Activity: 44 % — ABNORMAL LOW (ref 74–151)

## 2014-08-19 MED ORDER — WARFARIN SODIUM 1 MG PO TABS
1.0000 mg | ORAL_TABLET | Freq: Once | ORAL | Status: AC
  Administered 2014-08-19: 1 mg via ORAL
  Filled 2014-08-19: qty 1

## 2014-08-19 MED ORDER — POTASSIUM CHLORIDE CRYS ER 20 MEQ PO TBCR
40.0000 meq | EXTENDED_RELEASE_TABLET | ORAL | Status: AC
  Administered 2014-08-19 (×2): 40 meq via ORAL
  Filled 2014-08-19 (×2): qty 2

## 2014-08-19 MED ORDER — ONDANSETRON HCL 4 MG/2ML IJ SOLN
4.0000 mg | Freq: Four times a day (QID) | INTRAMUSCULAR | Status: DC | PRN
  Administered 2014-08-19: 4 mg via INTRAVENOUS
  Filled 2014-08-19: qty 2

## 2014-08-19 NOTE — Progress Notes (Signed)
ANTICOAGULATION CONSULT NOTE - Follow Up Consult  Pharmacy Consult for heparin Indication: pulmonary embolus  Labs:  Recent Labs  08/17/14 0130  08/17/14 0131  08/17/14 1924 08/18/14 0325 08/18/14 0802 08/19/14 0615 08/19/14 1325 08/19/14 2204  HGB  --   < > 10.5*  --   --   --  12.1 10.8*  --   --   HCT  --   --  29.0*  --   --   --  34.7* 31.3*  --   --   PLT  --   --  201  --   --   --  223 236  --   --   LABPROT  --   --   --   --  15.0 15.4*  --  25.0*  --   --   INR  --   --   --   --  1.17 1.21  --  2.24*  --   --   HEPARINUNFRC  --   --  0.20*  < > 0.35 0.31  --  0.71* 0.80* 0.78*  CREATININE  --   --  2.41*  --   --  1.90*  --  1.61*  --   --   TROPONINI 0.06*  --   --   --   --   --   --   --   --   --   < > = values in this interval not displayed.    Assessment: 44yo female remains supratherapeutic on heparin with little decrease in level despite rate decrease, apparently accumulated.  Goal of Therapy:  Heparin level 0.3-0.7 units/ml   Plan:  Will decrease heparin gtt by ~3 units/kg/hr to 1400 units and check level in 8hr.  Vernard GamblesVeronda Domanic Matusek, PharmD, BCPS  08/19/2014,11:17 PM

## 2014-08-19 NOTE — Significant Event (Signed)
Rapid Response Event Note Called to see pt for blurry vision Overview: Time Called: 0255 Arrival Time: 0303 Event Type: Unknown  Initial Focused Assessment: Pt states she awoke and called to nurse around 0230 because she was unable to see.  By the time the nurse arrived to the room her vision had returned but was blurry.  On assessment the pt is able to read the print media held in front of her but states it is more blurry with both eyes open.  She states it seems the Lt eye may be more blurry then the right but she is unsure.  I spoke with Dr. Amada JupiterKirkpatrick and he did not feel with the visual improvement and no other focal symptoms in conjunction with being on heparin and coumadin a stroke code should be activated.  The pt's last known time without visual changes was 0001.  Contacted Elray McgregorMary Lynch, NP with the primary team and updated her about the pt and my discussion with Dr. Amada JupiterKirkpatrick.  Will cont. To monitor as needed.  Interventions:   Event Summary: Name of Physician Notified: M. Burnadette PeterLynch, NP at [phone]  Name of Consulting Physician Notified: Dr. Amada JupiterKirkpatrick at [phone]  Outcome: Stayed in room and stabalized     Megan Cantu

## 2014-08-19 NOTE — Progress Notes (Addendum)
[facility] PROGRESS NOTE  Megan Cantu ZOX:[id number] DOB: [birth year] DOA: 08/13/2014 PCP: sees Dr Allena Katz in [place]  History taken using google translator app  Brief narrative 44 year old Hispanic female with no significant past medical history ( except for   pyelonephritis in 2009) presented with abdominal and left flank pain for  last 4 days. Patient reports taking Tylenol and Motrin without much relief. Patient also reports dark urine and headaches. She and was found to be septic with fever of 102.53F, tachycardic, hypotensive with acute kidney injury (creatinine of 2) without much improvement with IV fluid bolus.   A CT scan of the abdomen done in the ED showed nonspecific bilateral perinephric stranding possibly secondary to pyelonephritis and thickened gallbladder. Patient admitted to hospitalist service but was transitioned to ICU given  development of septic shock. UA on  admission was suggestive of UTI. She was placed on empiric vancomycin and Zosyn and bolused with IV normal saline.  Transferred to hospitalist service on 1/ 12 to assume  care from 1/13 but remained septic with temperature spike, persistent tachycardia hypertension and worsening renal function. EKG showing sinus tachycardia with mild ST depression in inferior leads and markedly elevated d-dimer. Started on heparin drip and VQ obtained showing intermediate probability for PE. -Patient showed clinical improvement and was transferred to medical floor    Assessment/Plan: Septic shock (resolved), secondary  to Escherichia coli UTI  with pyelonephritis -Possibly from underlying pyelonephritis and possible PNA.Marland Kitchen Urine culture growing Escherichia coli which is mostly sensitive. -Patient afebrile. Tachycardia and hypotension resolved. -On empiric Levaquin with plan to treat for 2 weeks antibiotic course..     Acute PE -Elevated d-dimer with intermediate probability for PE on VQ scan. Started on IV heparin and  Coumadin. Doppler negative for DVT of LE and LUE. 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 -She will need lifelong anticoagulation with Coumadin. -INR therapeutic today.   Elevated troponin No chest pain or changes on telemetry. Likely demand ischemia. Troponin plateud. Echo normal.  Acute tubular necrosis Possibly prerenal due to hypotension and sepsis.  Patient does report taking some Motrin prior to admission. FeNa >1 -low c3, normal C4, , Hbs antibody, HCV antibody and HIVab all negative. Uric acid normal. -Renal function improving. Appreciate nephrology consult.   Hypokalemia Replenish with kcl   Acute blurred vision / visual loss on 1/15 overnight Rapid response was called for the same. No neurological deficit noted. Vitals were stable. Patient reports blurred vision lasting for about a minute and complete loss of vision for a few seconds. Neurology was consulted however no code stroke was called given patient being on blood thinner and rapid resolution of symptoms. Head CT negative. Patient reports being nauseous this morning without any further symptoms. Will monitor clinically.  Protein calorie malnutrition nutrition consulted and added supplement.  Diet: Regular  DVT prophylaxis: IV heparin and Coumadin  Code Status: full code Family Communication: none at bedside Disposition Plan: likely discharge Home tomorrow with outpatient PCP follow-up.   Consultants:  PCCM   renal   procedure  None  Antibiotics:  IV vanco and zosyn (1/10--1/14  levaquin 1/14--  HPI/Subjective RRT was called early morning as patient complained of blurred vision and loss of vision lasting for 10-15 seconds. Symptoms resolved on its on. Vitals were stable. Neurology was consulted who recommended against code stroke given the rapid resolution of symptoms and patient being on anticoagulation. A head CT was done  which was unremarkable.  No further symptoms except for some nausea.  Objective: Filed Vitals:   08/19/14 0535  BP: 131/78  Pulse: 66  Temp: 98.3 F (36.8 C)  Resp: 20    Intake/Output Summary (Last 24 hours) at 08/19/14 1140 Last data filed at 08/18/14 1200  Gross per 24 hour  Intake     33 ml  Output    500 ml  Net   -467 ml   Filed Weights   08/14/14 1123 08/15/14 1924  Weight: 66.6 kg (146 lb 13.2 oz) 68.04 kg (150 lb)    Exam:   General: Middle aged female no acute distress  HEENT: , No pallor, moist oral mucosa, normal extraocular movement  Chest: Clear to auscultation bilaterally,   CVS: S1 and S2 normal,, no murmurs or gallop  Abdomen: Soft, nondistended, non tender,  bowel sounds present  Extremities: Warm, no edema,   CNS; alert and oriented.  Data Reviewed: Basic Metabolic Panel:  Recent Labs Lab 08/15/14 0250 08/16/14 0540 08/16/14 1930 08/17/14 0131 08/18/14 0325 08/19/14 0615  NA 141 138  --  142 140 142  K 3.3* 3.0*  --  3.2* 3.6 2.8*  CL 120* 115*  --  119* 114* 112  CO2 12* 16*  --  16* 18* 25  GLUCOSE 61* 128*  --  120* 93 113*  BUN 24* 18  --  16 16 15   CREATININE 2.73* 2.79*  --  2.41* 1.90* 1.61*  CALCIUM 7.1* 7.5*  --  7.9* 7.9* 7.9*  MG  --   --  2.1  --   --   --    Liver Function Tests:  Recent Labs Lab 08/13/14 2025 08/15/14 0930 08/16/14 0540  AST 20 20 21   ALT 14 13 12   ALKPHOS 84 76 85  BILITOT 1.6* 1.9* 1.2  PROT 6.4 5.0* 4.8*  ALBUMIN 2.7* 1.6* 1.5*    Recent Labs Lab 08/13/14 2025  LIPASE 20   No results for input(s): AMMONIA in the last 168 hours. CBC:  Recent Labs Lab 08/13/14 2025  08/15/14 0250 08/16/14 0540 08/17/14 0131 08/18/14 0802 08/19/14 0615  WBC 10.6*  < > 11.1* 13.7* 12.6* 11.4* 11.4*  NEUTROABS 9.5*  --   --   --   --   --   --   HGB 12.5  < > 10.3* 11.1* 10.5* 12.1 10.8*  HCT 35.4*  < > 30.2* 32.0* 29.0* 34.7* 31.3*  MCV 82.1  < > 84.1 82.3 82.6 83.0 81.7  PLT 143*  < > 152  195 201 223 236  < > = values in this interval not displayed. Cardiac Enzymes:  Recent Labs Lab 08/16/14 1225 08/16/14 1930 08/17/14 0130  TROPONINI 0.03 0.06* 0.06*   BNP (last 3 results) No results for input(s): PROBNP in the last 8760 hours. CBG:  Recent Labs Lab 08/14/14 1119  GLUCAP 92    Recent Results (from the past 240 hour(s))  Urine culture     Status: None   Collection Time: 08/13/14  9:41 PM  Result Value Ref Range Status   Specimen Description URINE, CLEAN CATCH  Final   Special Requests NONE  Final   Colony Count   Final    >=100,000 COLONIES/ML Performed at [facility]    Culture   Final    ESCHERICHIA COLI Performed at [facility]    Report Status 08/16/2014 FINAL  Final   Organism ID, Bacteria ESCHERICHIA COLI  Final      Susceptibility  Escherichia coli - MIC*    AMPICILLIN >=32 RESISTANT Resistant     CEFAZOLIN <=4 SENSITIVE Sensitive     CEFTRIAXONE <=1 SENSITIVE Sensitive     CIPROFLOXACIN <=0.25 SENSITIVE Sensitive     GENTAMICIN <=1 SENSITIVE Sensitive     LEVOFLOXACIN <=0.12 SENSITIVE Sensitive     NITROFURANTOIN <=16 SENSITIVE Sensitive     TOBRAMYCIN <=1 SENSITIVE Sensitive     TRIMETH/SULFA >=320 RESISTANT Resistant     PIP/TAZO <=4 SENSITIVE Sensitive     * ESCHERICHIA COLI  MRSA PCR Screening     Status: None   Collection Time: 08/14/14 11:22 AM  Result Value Ref Range Status   MRSA by PCR NEGATIVE NEGATIVE Final    Comment:        The GeneXpert MRSA Assay (FDA approved for NASAL specimens only), is one component of a comprehensive MRSA colonization surveillance program. It is not intended to diagnose MRSA infection nor to guide or monitor treatment for MRSA infections.   Culture, blood (routine x 2)     Status: None (Preliminary result)   Collection Time: 08/14/14  2:02 PM  Result Value Ref Range Status   Specimen Description BLOOD RIGHT ANTECUBITAL  Final   Special Requests BOTTLES DRAWN AEROBIC AND  ANAEROBIC 10CC  Final   Culture   Final           BLOOD CULTURE RECEIVED NO GROWTH TO DATE CULTURE WILL BE HELD FOR 5 DAYS BEFORE ISSUING A FINAL NEGATIVE REPORT Note: Culture results may be compromised due to an excessive volume of blood received in culture bottles. Performed at [facility]    Report Status PENDING  Incomplete  Culture, blood (routine x 2)     Status: None (Preliminary result)   Collection Time: 08/14/14  2:15 PM  Result Value Ref Range Status   Specimen Description BLOOD RIGHT HAND  Final   Special Requests BOTTLES DRAWN AEROBIC AND ANAEROBIC 10CC  Final   Culture   Final           BLOOD CULTURE RECEIVED NO GROWTH TO DATE CULTURE WILL BE HELD FOR 5 DAYS BEFORE ISSUING A FINAL NEGATIVE REPORT Note: Culture results may be compromised due to an excessive volume of blood received in culture bottles. Performed at [facility]    Report Status PENDING  Incomplete     Studies: Ct Head Wo Contrast  08/19/2014   CLINICAL DATA:  44 year old with headache and visual changes.  EXAM: CT HEAD WITHOUT CONTRAST  TECHNIQUE: Contiguous axial images were obtained from the base of the skull through the vertex without contrast.  COMPARISON:  04/01/2013  FINDINGS: Normal appearance of the intracranial structures. No evidence for acute hemorrhage, mass lesion, midline shift, hydrocephalus or large infarct. No acute bony abnormality. The visualized sinuses are clear.  IMPRESSION: Negative head CT.   Electronically Signed   By: Richarda OverlieAdam  Henn M.D.   On: 08/19/2014 08:17    Scheduled Meds: . coumadin book   Does not apply Once  . feeding supplement (RESOURCE BREEZE)  1 Container Oral BID BM  . levofloxacin (LEVAQUIN) IV  750 mg Intravenous Q48H  . potassium chloride  40 mEq Oral Q4H  . warfarin  1 mg Oral ONCE-1800  . Warfarin - Pharmacist Dosing Inpatient   Does not apply q1800   Continuous Infusions: . heparin 1,650 Units/hr (08/19/14 0803)      Time spent: 35  minutes    Luellen Howson  [facility] Pager [phone] If 7PM-7AM,  please contact night-coverage at [email or web address], password [facility] 08/19/2014, 11:40 AM  LOS: 6 days

## 2014-08-19 NOTE — Progress Notes (Addendum)
ANTICOAGULATION CONSULT NOTE - Follow Up Consult  Pharmacy Consult for Heparin, Coumadin Indication: pulmonary embolus  No Known Allergies  Patient Measurements: Height: 5\' 2"  (157.5 cm) Weight: 150 lb (68.04 kg) IBW/kg (Calculated) : 50.1 Heparin Dosing Weight: 64 kg  Vital Signs: Temp: 98.3 F (36.8 C) (01/16 0535) Temp Source: Oral (01/16 0535) BP: 131/78 mmHg (01/16 0535) Pulse Rate: 66 (01/16 0535)  Labs:  Recent Labs  08/16/14 1225  08/16/14 1930 08/17/14 0130  08/17/14 0131  08/17/14 1924 08/18/14 0325 08/18/14 0802 08/19/14 0615  HGB  --   --   --   --   < > 10.5*  --   --   --  12.1 10.8*  HCT  --   --   --   --   --  29.0*  --   --   --  34.7* 31.3*  PLT  --   --   --   --   --  201  --   --   --  223 236  LABPROT  --   --   --   --   --   --   --  15.0 15.4*  --  25.0*  INR  --   --   --   --   --   --   --  1.17 1.21  --  2.24*  HEPARINUNFRC  --   < > <0.10*  --   --  0.20*  < > 0.35 0.31  --  0.71*  CREATININE  --   --   --   --   --  2.41*  --   --  1.90*  --   --   TROPONINI 0.03  --  0.06* 0.06*  --   --   --   --   --   --   --   < > = values in this interval not displayed.  Estimated Creatinine Clearance: 34.9 mL/min (by C-G formula based on Cr of 1.9).   Medications:  Scheduled:  . coumadin book   Does not apply Once  . feeding supplement (RESOURCE BREEZE)  1 Container Oral BID BM  . levofloxacin (LEVAQUIN) IV  750 mg Intravenous Q48H  . Warfarin - Pharmacist Dosing Inpatient   Does not apply q1800   Infusions:  . heparin 1,700 Units/hr (08/19/14 0323)    Assessment: 44 year old female with VQ scan showing intermediate probability of pulmonary embolus on IV heparin. Patient noted with ARF; SCr trending down to 1.9 and  was 0.7 four months ago.  Heparin level slightly supratherapeutic at 0.71 with current infusion rate at 1700 units/hr. INR also jumped significantly from 1.21 to 2.24 after 2 doses of warfarin 7.5mg . Currently day 3 of  heparin/warfarin bridge (minimum 5 days needed). Of note, Hgb decreased from 12.1 to 10.8, plt stable at 236. Per RN, no bleeding is noted and no issues with heparin line.   Goal of Therapy:  Heparin level 0.3-0.7 units/ml Monitor platelets by anticoagulation protocol: Yes   Plan:  -Coumadin 1mg  po x1 today  -Decrease heparin to 1650 units/hr -6 hour HL -Daily HL, PT/INR, and CBC   Megan E. Supple, Pharm.D Clinical Pharmacy Resident Pager: [phone] 08/19/2014 8:01 AM   Adden (08/19/14 at 1548) : Heparin level now elevated at 0.8 despite rate decreased to 1650 units/hr.  No issues with IV line per pt's RN.  Will reduce rate to 1600 units/hr and recheck another 6 hour level to  assess new rate. Dorna Leitz, PharmD, BCPS

## 2014-08-20 LAB — BASIC METABOLIC PANEL
ANION GAP: 8 (ref 5–15)
BUN: 9 mg/dL (ref 6–23)
CO2: 24 mmol/L (ref 19–32)
Calcium: 8.1 mg/dL — ABNORMAL LOW (ref 8.4–10.5)
Chloride: 108 mEq/L (ref 96–112)
Creatinine, Ser: 1.4 mg/dL — ABNORMAL HIGH (ref 0.50–1.10)
GFR calc Af Amer: 53 mL/min — ABNORMAL LOW (ref >90)
GFR calc non Af Amer: 46 mL/min — ABNORMAL LOW (ref >90)
Glucose, Bld: 110 mg/dL — ABNORMAL HIGH (ref 70–99)
Potassium: 3.2 mmol/L — ABNORMAL LOW (ref 3.5–5.1)
SODIUM: 140 mmol/L (ref 135–145)

## 2014-08-20 LAB — CBC
HEMATOCRIT: 31.5 % — AB (ref 36.0–46.0)
Hemoglobin: 11.2 g/dL — ABNORMAL LOW (ref 12.0–15.0)
MCH: 28.9 pg (ref 26.0–34.0)
MCHC: 35.6 g/dL (ref 30.0–36.0)
MCV: 81.2 fL (ref 78.0–100.0)
PLATELETS: 239 10*3/uL (ref 150–400)
RBC: 3.88 MIL/uL (ref 3.87–5.11)
RDW: 13.9 % (ref 11.5–15.5)
WBC: 11.1 10*3/uL — ABNORMAL HIGH (ref 4.0–10.5)

## 2014-08-20 LAB — FACTOR 5 LEIDEN

## 2014-08-20 LAB — PROTIME-INR
INR: 2.29 — AB (ref 0.00–1.49)
Prothrombin Time: 25.4 seconds — ABNORMAL HIGH (ref 11.6–15.2)

## 2014-08-20 LAB — CULTURE, BLOOD (ROUTINE X 2)
CULTURE: NO GROWTH
Culture: NO GROWTH

## 2014-08-20 LAB — PROTHROMBIN GENE MUTATION

## 2014-08-20 LAB — HEPARIN LEVEL (UNFRACTIONATED): HEPARIN UNFRACTIONATED: 0.58 [IU]/mL (ref 0.30–0.70)

## 2014-08-20 MED ORDER — POTASSIUM CHLORIDE CRYS ER 20 MEQ PO TBCR
40.0000 meq | EXTENDED_RELEASE_TABLET | Freq: Once | ORAL | Status: AC
  Administered 2014-08-20: 40 meq via ORAL
  Filled 2014-08-20: qty 2

## 2014-08-20 MED ORDER — WARFARIN SODIUM 5 MG PO TABS
5.0000 mg | ORAL_TABLET | Freq: Once | ORAL | Status: AC
  Administered 2014-08-20: 5 mg via ORAL
  Filled 2014-08-20: qty 1

## 2014-08-20 NOTE — Progress Notes (Signed)
ANTICOAGULATION CONSULT NOTE - Follow Up Consult  Pharmacy Consult for heparin and levaquin Indication: pulmonary embolus, PNA/UTI  No Known Allergies  Patient Measurements: Height: 5\' 2"  (157.5 cm) Weight: 150 lb (68.04 kg) IBW/kg (Calculated) : 50.1 Heparin Dosing Weight: 64 kg  Vital Signs: Temp: 99.1 F (37.3 C) (01/17 0544) Temp Source: Oral (01/17 0544) BP: 141/79 mmHg (01/17 0544) Pulse Rate: 63 (01/17 0544)  Labs:  Recent Labs  08/18/14 0325  08/18/14 0802 08/19/14 0615 08/19/14 1325 08/19/14 2204 08/20/14 0603 08/20/14 0937  HGB  --   < > 12.1 10.8*  --   --  11.2*  --   HCT  --   --  34.7* 31.3*  --   --  31.5*  --   PLT  --   --  223 236  --   --  239  --   LABPROT 15.4*  --   --  25.0*  --   --   --  25.4*  INR 1.21  --   --  2.24*  --   --   --  2.29*  HEPARINUNFRC 0.31  --   --  0.71* 0.80* 0.78*  --  0.58  CREATININE 1.90*  --   --  1.61*  --   --  1.40*  --   < > = values in this interval not displayed.  Estimated Creatinine Clearance: 47.4 mL/min (by C-G formula based on Cr of 1.4).  Assessment: Patient is a 44 y.o F with positive lupus anticoagulant on anticoagulation overlap day #4 for new PE.  Heparin level is therapeutic at 0.58 and INR is at goal with 2.29.  She's also on levaquin for UTI and suspected PNA.  Scr continues to improve with 1.40 today (crcl~47).  Afeb, wbc 11.1 1/11 BCx - neg FINAL 1/11 UCx - E coli - pansensitive, except R to Septra 1/11 MRSA PCR neg  LQ 1/14>> Vanc 1/11 >> 1/14 Zosyn 1/11 >> 1/14  Goal of Therapy:  INR 2-3 Heparin level 0.3-0.7 units/ml Monitor platelets by anticoagulation protocol: Yes   Plan:  - continue heparin at 1400 units/hr (recommend to continue heparin for at least 5 days total even with therapeutic INR d/t new VTE event) - coumadin 5mg  PO x1 today - continue Levaquin 750mg  IV q48h for now (f/u renal funct, probably need to adjust dose soon)  Megan Cantu P 08/20/2014,10:30 AM

## 2014-08-20 NOTE — Progress Notes (Signed)
[facility] PROGRESS NOTE  Megan Cantu ZOX:[id number] DOB: January 27, 1971 DOA: 08/13/2014 PCP: Dr Maralyn [place]Sarah  [place]  History taken with the help of patient's spanish speaking nurse for today.  Brief narrative 44 year old Hispanic female with no significant past medical history ( except for   pyelonephritis in 2009) presented with abdominal and left flank pain for  last 4 days. Patient reports taking Tylenol and Motrin without much relief. Patient also reports dark urine and headaches. She and was found to be septic with fever of 102.32F, tachycardic, hypotensive with acute kidney injury (creatinine of 2) without much improvement with IV fluid bolus.   A CT scan of the abdomen done in the ED showed nonspecific bilateral perinephric stranding possibly secondary to pyelonephritis and thickened gallbladder. Patient admitted to hospitalist service but was transitioned to ICU given  development of septic shock. UA on  admission was suggestive of UTI. She was placed on empiric vancomycin and Zosyn and bolused with IV normal saline.  Transferred to hospitalist service on 1/ 12 to assume  care from 1/13 but remained septic with temperature spike, persistent tachycardia hypertension and worsening renal function. EKG showing sinus tachycardia with mild ST depression in inferior leads and markedly elevated d-dimer. Started on heparin drip and VQ obtained showing intermediate probability for PE. -Patient showed clinical improvement and was transferred to medical floor .   Assessment/Plan: Septic shock (resolved), secondary  to Escherichia coli UTI  with pyelonephritis -Possibly from underlying pyelonephritis and possible PNA.Marland Kitchen. Urine culture growing Escherichia coli which is mostly sensitive. -Sepsis now resolved. -On empiric Levaquin with plan to treat for 2 weeks antibiotic course..     Acute PE -Elevated d-dimer with intermediate probability for PE on VQ scan. Started on IV heparin and  Coumadin. Doppler negative for DVT of LE and LUE. 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 -She will need lifelong anticoagulation with Coumadin. -INR therapeutic needs 1 more day of IV heparin overlap.   Elevated troponin No chest pain or changes on telemetry. Likely demand ischemia. Troponin plateud. Echo normal.  Acute tubular necrosis Possibly prerenal due to hypotension and sepsis.  Patient does report taking some Motrin prior to admission. FeNa >1 -low c3, normal C4, , Hbs antibody, HCV antibody and HIVab all negative. Uric acid normal. -Renal function improving. Appreciate nephrology consult.   Hypokalemia Replenish with kcl   Acute blurred vision / visual loss on 1/15 overnight Rapid response was called for the same. No neurological deficit noted. Vitals were stable. Patient reports blurred vision lasting for about a minute and complete loss of vision for a few seconds. Neurology was consulted however no code stroke was called given patient being on blood thinner and rapid resolution of symptoms. Head CT negative.  No further symptoms.  Protein calorie malnutrition nutrition consulted and added supplement.  Diet: Regular  DVT prophylaxis: IV heparin and Coumadin  Code Status: full code Family Communication: none at bedside Disposition Plan: needs 1 more day of IV heparin overlap. Discharge home tomorrow with outpatient PCP follow-up   Consultants:  PCCM   renal   procedure  None  Antibiotics:  IV vanco and zosyn (1/10--1/14  levaquin 1/14--  HPI/Subjective Patient seen and examined. Denies any symptoms.  Objective: Filed Vitals:   08/20/14 1258  BP: 149/79  Pulse: 65  Temp: 98.3 F (36.8 C)  Resp: 16    Intake/Output Summary (Last 24 hours) at 08/20/14 1304 Last data filed at 08/20/14  1117  Gross per 24 hour  Intake    300 ml  Output      0 ml  Net    300 ml    Filed Weights   08/14/14 1123 08/15/14 1924  Weight: 66.6 kg (146 lb 13.2 oz) 68.04 kg (150 lb)    Exam:   General: no acute distress  HEENT: , Moist oral mucosa  Chest: Clear to auscultation bilaterally,   CVS: S1 and S2 normal,, no murmurs or gallop  Abdomen: Soft, nondistended, non tender,    Extremities: Warm, no edema,     Data Reviewed: Basic Metabolic Panel:  Recent Labs Lab 08/16/14 0540 08/16/14 1930 08/17/14 0131 08/18/14 0325 08/19/14 0615 08/20/14 0603  NA 138  --  142 140 142 140  K 3.0*  --  3.2* 3.6 2.8* 3.2*  CL 115*  --  119* 114* 112 108  CO2 16*  --  16* 18* 25 24  GLUCOSE 128*  --  120* 93 113* 110*  BUN 18  --  CREATININE 2.79*  --  2.41* 1.90* 1.61* 1.40*  CALCIUM 7.5*  --  7.9* 7.9* 7.9* 8.1*  MG  --  2.1  --   --   --   --    Liver Function Tests:  Recent Labs Lab 08/13/14 2025 08/15/14 0930 08/16/14 0540  AST ALT ALKPHOS 84 76 85  BILITOT 1.6* 1.9* 1.2  PROT 6.4 5.0* 4.8*  ALBUMIN 2.7* 1.6* 1.5*    Recent Labs Lab 08/13/14 2025  LIPASE 20   No results for input(s): AMMONIA in the last 168 hours. CBC:  Recent Labs Lab 08/13/14 2025  08/16/14 0540 08/17/14 0131 08/18/14 0802 08/19/14 0615 08/20/14 0603  WBC 10.6*  < > 13.7* 12.6* 11.4* 11.4* 11.1*  NEUTROABS 9.5*  --   --   --   --   --   --   HGB 12.5  < > 11.1* 10.5* 12.1 10.8* 11.2*  HCT 35.4*  < > 32.0* 29.0* 34.7* 31.3* 31.5*  MCV 82.1  < > 82.3 82.6 83.0 81.7 81.2  PLT 143*  < > 195 201 223 236 239  < > = values in this interval not displayed. Cardiac Enzymes:  Recent Labs Lab 08/16/14 1225 08/16/14 1930 08/17/14 0130  TROPONINI 0.03 0.06* 0.06*   BNP (last 3 results) No results for input(s): PROBNP in the last 8760 hours. CBG:  Recent Labs Lab 08/14/14 1119  GLUCAP 92    Recent Results (from the past 240 hour(s))  Urine culture     Status: None   Collection Time: 08/13/14  9:41 PM  Result Value Ref  Range Status   Specimen Description URINE, CLEAN CATCH  Final   Special Requests NONE  Final   Colony Count   Final    >=100,000 COLONIES/ML Performed at [facility]    Culture   Final    ESCHERICHIA COLI Performed at [facility]    Report Status 08/16/2014 FINAL  Final   Organism ID, Bacteria ESCHERICHIA COLI  Final      Susceptibility   Escherichia coli - MIC*    AMPICILLIN >=32 RESISTANT Resistant     CEFAZOLIN <=4 SENSITIVE Sensitive     CEFTRIAXONE <=1 SENSITIVE Sensitive     CIPROFLOXACIN <=0.25 SENSITIVE Sensitive     GENTAMICIN <=1 SENSITIVE Sensitive     LEVOFLOXACIN <=0.12 SENSITIVE Sensitive  NITROFURANTOIN <=16 SENSITIVE Sensitive     TOBRAMYCIN <=1 SENSITIVE Sensitive     TRIMETH/SULFA >=320 RESISTANT Resistant     PIP/TAZO <=4 SENSITIVE Sensitive     * ESCHERICHIA COLI  MRSA PCR Screening     Status: None   Collection Time: 08/14/14 11:22 AM  Result Value Ref Range Status   MRSA by PCR NEGATIVE NEGATIVE Final    Comment:        The GeneXpert MRSA Assay (FDA approved for NASAL specimens only), is one component of a comprehensive MRSA colonization surveillance program. It is not intended to diagnose MRSA infection nor to guide or monitor treatment for MRSA infections.   Culture, blood (routine x 2)     Status: None   Collection Time: 08/14/14  2:02 PM  Result Value Ref Range Status   Specimen Description BLOOD RIGHT ANTECUBITAL  Final   Special Requests BOTTLES DRAWN AEROBIC AND ANAEROBIC 10CC  Final   Culture   Final    NO GROWTH 5 DAYS Note: Culture results may be compromised due to an excessive volume of blood received in culture bottles. Performed at [facility]    Report Status 08/20/2014 FINAL  Final  Culture, blood (routine x 2)     Status: None   Collection Time: 08/14/14  2:15 PM  Result Value Ref Range Status   Specimen Description BLOOD RIGHT HAND  Final   Special Requests BOTTLES DRAWN AEROBIC AND ANAEROBIC  10CC  Final   Culture   Final    NO GROWTH 5 DAYS Note: Culture results may be compromised due to an excessive volume of blood received in culture bottles. Performed at [facility]    Report Status 08/20/2014 FINAL  Final     Studies: Ct Head Wo Contrast  08/19/2014   CLINICAL DATA:  44 year old with headache and visual changes.  EXAM: CT HEAD WITHOUT CONTRAST  TECHNIQUE: Contiguous axial images were obtained from the base of the skull through the vertex without contrast.  COMPARISON:  04/01/2013  FINDINGS: Normal appearance of the intracranial structures. No evidence for acute hemorrhage, mass lesion, midline shift, hydrocephalus or large infarct. No acute bony abnormality. The visualized sinuses are clear.  IMPRESSION: Negative head CT.   Electronically Signed   By: Richarda Overlie M.D.   On: 08/19/2014 08:17    Scheduled Meds: . coumadin book   Does not apply Once  . feeding supplement (RESOURCE BREEZE)  1 Container Oral BID BM  . levofloxacin (LEVAQUIN) IV  750 mg Intravenous Q48H  . warfarin  5 mg Oral ONCE-1800  . Warfarin - Pharmacist Dosing Inpatient   Does not apply q1800   Continuous Infusions: . heparin 1,400 Units/hr (08/20/14 1054)      Time spent: 35 minutes    Azreal Stthomas  [facility] Pager [phone] If 7PM-7AM, please contact night-coverage at [email or web address], password [facility] 08/20/2014, 1:04 PM  LOS: 7 days

## 2014-08-21 DIAGNOSIS — R6521 Severe sepsis with septic shock: Secondary | ICD-10-CM

## 2014-08-21 DIAGNOSIS — I2699 Other pulmonary embolism without acute cor pulmonale: Secondary | ICD-10-CM | POA: Diagnosis present

## 2014-08-21 DIAGNOSIS — N170 Acute kidney failure with tubular necrosis: Secondary | ICD-10-CM

## 2014-08-21 DIAGNOSIS — A4151 Sepsis due to Escherichia coli [E. coli]: Principal | ICD-10-CM

## 2014-08-21 LAB — BASIC METABOLIC PANEL
Anion gap: 4 — ABNORMAL LOW (ref 5–15)
BUN: 7 mg/dL (ref 6–23)
CALCIUM: 8.5 mg/dL (ref 8.4–10.5)
CO2: 27 mmol/L (ref 19–32)
CREATININE: 1.29 mg/dL — AB (ref 0.50–1.10)
Chloride: 107 mEq/L (ref 96–112)
GFR calc Af Amer: 58 mL/min — ABNORMAL LOW (ref >90)
GFR, EST NON AFRICAN AMERICAN: 50 mL/min — AB (ref >90)
Glucose, Bld: 104 mg/dL — ABNORMAL HIGH (ref 70–99)
Potassium: 3.2 mmol/L — ABNORMAL LOW (ref 3.5–5.1)
Sodium: 138 mmol/L (ref 135–145)

## 2014-08-21 LAB — CBC
HCT: 32.7 % — ABNORMAL LOW (ref 36.0–46.0)
HEMOGLOBIN: 11.5 g/dL — AB (ref 12.0–15.0)
MCH: 28.7 pg (ref 26.0–34.0)
MCHC: 35.2 g/dL (ref 30.0–36.0)
MCV: 81.5 fL (ref 78.0–100.0)
PLATELETS: 297 10*3/uL (ref 150–400)
RBC: 4.01 MIL/uL (ref 3.87–5.11)
RDW: 13.6 % (ref 11.5–15.5)
WBC: 10.3 10*3/uL (ref 4.0–10.5)

## 2014-08-21 LAB — HEPARIN LEVEL (UNFRACTIONATED): Heparin Unfractionated: 0.68 IU/mL (ref 0.30–0.70)

## 2014-08-21 LAB — BETA-2-GLYCOPROTEIN I ABS, IGG/M/A
BETA 2 GLYCO I IGG: 14 G Units (ref <20)
BETA-2-GLYCOPROTEIN I IGM: 6 M Units (ref <20)
Beta-2-Glycoprotein I IgA: 2 A Units (ref <20)

## 2014-08-21 LAB — CARDIOLIPIN ANTIBODIES, IGG, IGM, IGA
ANTICARDIOLIPIN IGA: 7 U/mL — AB (ref <22)
Anticardiolipin IgG: 14 GPL U/mL (ref <23)
Anticardiolipin IgM: 6 MPL U/mL — ABNORMAL LOW (ref <11)

## 2014-08-21 LAB — PROTIME-INR
INR: 2.22 — AB (ref 0.00–1.49)
Prothrombin Time: 24.8 seconds — ABNORMAL HIGH (ref 11.6–15.2)

## 2014-08-21 MED ORDER — POTASSIUM CHLORIDE CRYS ER 20 MEQ PO TBCR: 20.0000 meq | EXTENDED_RELEASE_TABLET | Freq: Every day | ORAL | Status: AC

## 2014-08-21 MED ORDER — WARFARIN SODIUM 5 MG PO TABS
5.0000 mg | ORAL_TABLET | Freq: Every day | ORAL | Status: DC
  Filled 2014-08-21: qty 1

## 2014-08-21 MED ORDER — LEVOFLOXACIN 750 MG PO TABS
750.0000 mg | ORAL_TABLET | ORAL | Status: DC
  Filled 2014-08-21: qty 1

## 2014-08-21 MED ORDER — POTASSIUM CHLORIDE CRYS ER 20 MEQ PO TBCR: 40.0000 meq | EXTENDED_RELEASE_TABLET | Freq: Once | ORAL | Status: DC

## 2014-08-21 MED ORDER — LEVOFLOXACIN 750 MG PO TABS: 750.0000 mg | ORAL_TABLET | ORAL | Status: AC

## 2014-08-21 MED ORDER — WARFARIN SODIUM 5 MG PO TABS: 5.0000 mg | ORAL_TABLET | Freq: Every day | ORAL | Status: AC

## 2014-08-21 MED ORDER — BOOST / RESOURCE BREEZE PO LIQD: 1.0000 | Freq: Two times a day (BID) | ORAL | Status: AC

## 2014-08-21 NOTE — Discharge Instructions (Signed)
Warfarina: lo que debe saber (Warfarin: What You Need to Know) La warfarina es un anticoagulante. Los anticoagulantes ayudan a prevenir la formacin de cogulos de [place]. Tambin ayudan a Press photographer de cogulos de [place]. A veces, se dice que la warfarina es un "anticoagulante".  Normalmente, cuando hay un corte o lesin en los tejidos, los cogulos impiden la prdida de [place]. A veces, se forman cogulos dentro de los vasos sanguneos que obstruyen el flujo sanguneo a travs del sistema circulatorio (trombosis). Estos cogulos pueden viajar por el torrente sanguneo y se alojan en los vasos sanguneos pequeos del cerebro, lo que puede causar un ictus, o en los pulmones (embolia pulmonar). Rozann Lesches DEBEN UTILIZAR WARFARINA La warfarina se receta en aquellos pacientes que tienen riesgo de desarrollar cogulos sanguneos peligrosos:  Las personas que tienen una vlvula cardaca mecnica, un ritmo cardaco irregular llamado fibrilacin auricular o ciertos trastornos de la coagulacin.  Las personas que tuvieron cogulos sanguneos peligrosos en el pasado, incluidas las que tuvieron un ictus, una embolia pulmonar o trombosis en las piernas (trombosis venosa profunda [TVP]).  Las personas con un cogulo sanguneo existente, como una embolia pulmonar. DOSIS DE WARFARINA Los comprimidos de warfarina vienen en diferentes concentraciones. Cada uno de ellos es de diferente color y tiene la cantidad de warfarina (en miligramos) claramente impresa en el comprimido. Si cuando le dan una nueva receta el color del comprimido es diferente al habitual, infrmelo inmediatamente a su farmacutico o mdico. SUPERVISIN DE LA WARFARINA El objetivo del tratamiento con warfarina es disminuir la formacin de cogulos, pero no impedir completamente la coagulacin. Su mdico supervisar atentamente el efecto anticoagulante de la warfarina y ajustar la dosis segn sea necesario. Por su seguridad, se usan anlisis  de sangre llamados tiempo de protrombina (PT) o ndice internacional normalizado (INR). Ambos anlisis pueden hacerse con un pinchazo en el dedo o con una extraccin de [place]. Cuanto ms tiempo necesite la sangre para Biochemist, clinical, mayor ser el nivel de [place] o de INR. Su mdico le informar cul es su rango de PT o INR "deseado". Si en cualquier momento su PT o INR estn por encima del rango deseado, existe riesgo de hemorragia. Si el nivel de PT o INR es menor que el rango deseado, existe riesgo de formar cogulos. Ya sea que comience a recibir Dispensing optician est hospitalizado o en el consultorio de su mdico, deber controlarse el PT o INR dentro de la primera semana de Advice worker. Inicialmente, a International aid/development worker se les indica que se controlen el PT o INR ONEOK veces por semana. Una vez que se haya encontrado la dosis de mantenimiento, Nutritional therapist PT o INR con menos frecuencia, generalmente una vez [address] a cuatro semanas. La dosis de warfarina podra ajustarse si el PT o INR no estn dentro del rango deseado. Es importante que cumpla con los controles de laboratorio y las consultas mdicas que le indiquen. No cumplir con las visitas puede resultar en una lesin, dolor o discapacidad crnica o permanente debido a que la warfarina es un medicamento que requiere una estrecha vigilancia. CULES SON LOS EFECTOS ADVERSOS DE LA WARFARINA?  Mucha cantidad de warfarina puede producir sangrado (hemorragia) en cualquier parte del cuerpo. Podra ser sangrado de las encas, sangre en la [place], materia fecal con sangre u oscura, hemorragia nasal que no frena fcilmente, escupir sangre al toser o vomitar con [employer].  Muy poca cantidad de warfarina puede aumentar el riesgo de cogulos sanguneos.  Muy poca  o mucha warfarina tambin puede aumentar el riesgo de ictus.  La warfarina puede causar una erupcin cutnea o irritacin, fiebre inusual, nuseas o Programme researcher, broadcasting/film/videomalestar estomacal constantes o dolor  intenso en las articulaciones o la espalda. PRECAUCIONES ESPECIALES MIENTRAS SE EST TOMANDO WARFARINA La warfarina debe ser tomada exactamente como se le indic. Es muy importante que reciba la warfarina segn las indicaciones, ya que podra tener tanto una hemorragia como cogulos sanguneos que terminen produciendo una lesin, Engineer, miningdolor o discapacidad crnica o Bunnellpermanente.  Publixome el medicamento todos los das a la misma hora. Si olvida tomar una dosis, puede tomarla si an no pasaron 6horas del momento correspondiente.  No modifique la dosis de warfarina por sus propios medios para compensar la dosis que ha omitido.  Si omite ms de dos dosis consecutivas, comunquese con su mdico para que le d indicaciones. Evite las situaciones que le puedan producir hemorragias. Puede tener tendencia a sangrar ms fcilmente que lo habitual mientras toma warfarina. Las siguientes recomendaciones pueden limitar el sangrado:  Utilice un cepillo de dientes blando.  Utilice un hilo dental encerado.  Rasrese con Careers adviserafeitadora elctrica y no con hoja de Public affairs consultantafeitar.  Limite el uso de objetos afilados.  Evite las 1 Robert Wood Johnson Placeactividades potencialmente peligrosas, como los deportes de Gloustercontacto. Warfarina y Psychiatristembarazo o amamantamiento  No se aconseja el uso de warfarina durante Financial risk analystel primer trimestre del embarazo debido al aumento de riesgo para el beb de tener defectos congnitos. En ciertas situaciones, una mujer puede tomar warfarina despus del primer trimestre del embarazo. Una mujer que queda embarazada o planifica estarlo mientras toma warfarina debe informarlo al mdico inmediatamente.  Aunque la warfarina no pasa hacia la [place] materna, una mujer que desea Network engineeramamantar mientras est tomando warfarina debe consultarlo con su mdico. Consumo de alcohol, tabaquismo y drogas ilegales   El alcohol afecta la forma en que la warfarina funciona en el organismo. Es Therapist, sportspreferible evitar las bebidas alcohlicas o consumir muy pequeas  cantidades cuando se est en tratamiento con warfarina. En general, debe limitar el consumo de alcohol a 1onza (30ml) de licor, 6onzas (180ml) de vino o 12onzas (360ml) de cerveza por da. Hgale saber a su mdico si modifica su consumo de alcohol.  El tabaquismo afecta el funcionamiento de la warfarina. Lo ideal es evitar fumar cuando se est en tratamiento con warfarina. Hgale saber a su mdico si modifica su hbito de tabaquismo.  Lo ideal es evitar todas las drogas ilegales cuando se est en tratamiento con warfarina porque existen pocos estudios que muestren cmo interacta con estas drogas. Otros medicamentos y suplementos nutricionales Muchos medicamentos recetados y de venta libre pueden interferir con la warfarina. Asegrese de informar a todos sus mdicos que est tomando warfarina. Notifique al mdico que le recet la warfarina o al farmacutico antes de comenzar o interrumpir cualquier otro medicamento, incluso las vitaminas, los suplementos dietarios y los medicamentos para el dolor de Mundys Cornerventa libre. Es posible que deba ajustar la dosis de warfarina. Algunos medicamentos de Praxairventa libre que pueden aumentar el riesgo de sangrado mientras est tomando warfarina son los siguientes:  Paracetamol.  Aspirina.  Antiinflamatorios no esteroides (AINE), como ibuprofeno o naproxeno.  Vitamina E. Consideraciones nutricionales Los alimentos con cantidades moderadas o altas de vitaminaK interfieren con la warfarina. Evite cambios importantes en su dieta, o hable con su mdico antes de cambiarla. Consuma una cantidad consistente de alimentos con un nivel moderado o alto de vitaminaK. Consumir menos alimentos con vitaminaK puede aumentar el riesgo de The Villagessangrado. Consumir ms de Toys ''R'' Usestos  alimentos puede aumentar el riesgo de cogulos sanguneos. Consulte a un nutricionista si tiene otras preguntas sobre las consideraciones en la dieta. Alimentos que tienen un nivel muy alto de vitaminaK:  Los  vegetales, como la acelga suiza y Glass blower/designerla remolacha, y las hojas de Midlothianberza, Ugandamostaza o nabo (frescos o congelados, cocidos).  Col rizada (fresca o congelada, cocida).  Perejil (crudo).  Espinaca (cocida). Alimentos que tienen un nivel alto de vitaminaK:  Paediatric nursesprragos (frescos, cocidos).  Frijoles verdes (frescos, cocidos).  Brcoli.  Repollo chino (cocido).  Repollitos de Bruselas (frescos o congelados, cocidos).  Repollo (cocido).   Col. Alimentos que tienen un nivel moderado de vitaminaK:  Arndanos.  Frijoles carita.  Endivia (cruda).  Deatra JamesLechuga de hoja verde (cruda).  Cebollines verdes (crudos).  Col rizada (cruda).  Quimbomb (congelado, cocido).  Pltanos (fritos).  Cathleen FearsLechuga romana (cruda).  Chucrut (en lata).  Espinaca (cruda). LLAME A SU CLNICA O MDICO SI USTED:  Planifica someterse a una ciruga o un procedimiento.  Se siente enfermo, especialmente si tiene diarrea o vmitos.  Hace o planifica hacer cambios importantes en la dieta.  Comienza a tomar o interrumpe medicamentos recetados o de venta libre.  Ardelle AntonQueda embarazada, planifica quedar o cree estarlo.  Tiene perodos menstruales ms abundantes.  Sufre una cada, un accidente o tiene sntomas de sangrado o hematomas inusuales.  Tiene fiebre inusual. LLAME AL 911 EN LOS ESTADOS UNIDOS O CONCURRA AL SERVICIO DE EMERGENCIAS SI USTED:   Cree que tiene una reaccin alrgica a la warfarina. Los signos de [place] reaccin alrgica pueden ser picazn, erupcin, ronchas, hinchazn, opresin en el pecho o problemas para respirar.  Observa sangre en la orina. Los signos pueden ser orina de color t, rojizo o rosado.  Observa sangre en las heces. Los signos pueden ser heces negras o rojo brillante.  Vomita o tose con sangre. En estos casos, la sangre podra ser de color rojo brillante o tener la apariencia de "caf molido".  Tiene sangrado que no se interrumpe despus de aplicar presin durante 30minutos,  por ejemplo cortes, sangrado de la nariz u otras lesiones.  Tiene dolor intenso en las articulaciones o la espalda.  Tiene dolor de cabeza intenso.  Siente debilidad o adormecimiento sbito de la cara, el brazo o la pierna, especialmente en un lado del cuerpo.  Siente confusin o problemas para comprender sbitamente.  Tiene dificultad sbita en la visin de uno o ambos ojos.  Tiene problemas para caminar, mareos, prdida del equilibrio o de la coordinacin sbitamente.  Tiene dificultad para hablar o comprender (afasia). Document Released: 11/05/2006 Document Revised: 12/05/2013 [facility] GlenoaksExitCare Patient Information 2015 [place], [place]. This information is not intended to replace advice given to you by your health care provider. Make sure you discuss any questions you have with your health care provider.

## 2014-08-21 NOTE — Progress Notes (Signed)
ANTICOAGULATION CONSULT NOTE - Follow Up Consult  Pharmacy Consult for Heparin, Coumadin Indication: pulmonary embolus  No Known Allergies  Labs:  Recent Labs  08/19/14 0615  08/19/14 2204 08/20/14 0603 08/20/14 0937 08/21/14 0642  HGB 10.8*  --   --  11.2*  --  11.5*  HCT 31.3*  --   --  31.5*  --  32.7*  PLT 236  --   --  239  --  297  LABPROT 25.0*  --   --   --  25.4* 24.8*  INR 2.24*  --   --   --  2.29* 2.22*  HEPARINUNFRC 0.71*  < > 0.78*  --  0.58 0.68  CREATININE 1.61*  --   --  1.40*  --  1.29*  < > = values in this interval not displayed.  Estimated Creatinine Clearance: 51.4 mL/min (by C-G formula based on Cr of 1.29).   Assessment: 44 year old female on Day # 5 of heparin / Coumadin overlap for PE INR today = 2.22 (Levaqin may increase INR) Heparin level therapeutic CBC stable  Goal of Therapy:  Heparin level 0.3-0.7 units/ml Monitor platelets by anticoagulation protocol: Yes  INR = 2 to 3   Plan:  Continue heparin at 1400 units / hr (can be stopped today) Coumadin 5 mg po daily at 1800 pm (recommend as home dose) Follow up AM labs if not discharged  Next follow up INR Wednesday or Thursday  Thank you. Okey RegalLisa Tiana Sivertson, PharmD [phone]  -08/21/2014 10:20 AM

## 2014-08-21 NOTE — Progress Notes (Addendum)
NURSING PROGRESS NOTE  Megan Stabselis Carano [id number] Discharge Data: 08/21/2014 1:47 PM Attending Provider: Eddie NorthNishant Dhungel, MD QMV:HQIONPCP:PATEL, Maralyn SagoSARAH, MD     Megan Cantu to be D/C'd Home per MD order.  Discussed with the patient the After Visit Summary and all questions fully answered using a telephone interpretor (interpretor ID [id number]). All IV's discontinued with no bleeding noted. All belongings returned to patient for patient to take home.   Last Vital Signs:  Blood pressure 112/68, pulse 72, temperature 98.3 F (36.8 C), temperature source Oral, resp. rate 18, height 5\' 2"  (1.575 m), weight 68.04 kg (150 lb), last menstrual period 08/08/2014, SpO2 98 %.  Discharge Medication List   Medication List    STOP taking these medications        aspirin 325 MG tablet     naproxen 500 MG tablet  Commonly known as:  NAPROSYN      TAKE these medications        feeding supplement (RESOURCE BREEZE) Liqd  Take 1 Container by mouth 2 (two) times daily between meals.     levofloxacin 750 MG tablet  Commonly known as:  LEVAQUIN  Take 1 tablet (750 mg total) by mouth daily.  Start taking on:  08/22/2014     potassium chloride SA 20 MEQ tablet  Commonly known as:  K-DUR,KLOR-CON  Take 1 tablet (20 mEq total) by mouth daily.     warfarin 5 MG tablet  Commonly known as:  COUMADIN  Take 1 tablet (5 mg total) by mouth daily at 6 PM.         Cathlyn Parsonsattha Mariame Rybolt, RN

## 2014-08-21 NOTE — Discharge Summary (Addendum)
Physician Discharge Summary  Megan Cantu ZOX:[id number] DOB: [birth year] DOA: 08/13/2014  PCP: Hillery Aldo, MD  Admit date: 08/13/2014 Discharge date: 08/21/2014  Time spent: 35  minutes  Recommendations for Outpatient Follow-up:  1. Discharge home with outpatient PCP follow-up within next 3-4 days .( Office was closed today so could not call in to make appointment) 2. She is being discharged on warfarin for PE and positive lupus anticoagulant. She will need to be on lifelong anticoagulant. 3. Patient will be discharged on Levaquin for 7 more days to complete 2 weeks course of antibiotics (last date of antibiotic 08/27/2014)   Discharge Diagnoses:  Principal Problem:   E. coli septic shock   Active Problems:   AKI (acute kidney injury)   Sepsis secondary to UTI   Lupus anticoagulant positive   Hypokalemia   Pyelonephritis, acute   Acute pulmonary embolism   Discharge Condition: Fair  Diet recommendation: Regular  Filed Weights   08/14/14 1123 08/15/14 1924  Weight: 66.6 kg (146 lb 13.2 oz) 68.04 kg (150 lb)    History of present illness:  Please refer to admission H&P for details, but in brief, 44 year old Hispanic female with no significant past medical history ( except for pyelonephritis in 2009) presented with abdominal and left flank pain for last 4 days. Patient reports taking Tylenol and Motrin without much relief. Patient also reports dark urine and headaches. She and was found to be septic with fever of 102.65F, tachycardic, hypotensive with acute kidney injury (creatinine of 2) without much improvement with IV fluid bolus.  A CT scan of the abdomen done in the ED showed nonspecific bilateral perinephric stranding possibly secondary to pyelonephritis and thickened gallbladder. Patient admitted to hospitalist service but was transitioned to ICU given development of septic shock. UA on admission was suggestive of UTI. She was placed on empiric vancomycin and Zosyn  and bolused with IV normal saline.  Transferred to hospitalist service on 1/ 12 to assume care from 1/13 but remained septic with temperature spike, persistent tachycardia hypertension and worsening renal function. EKG showing sinus tachycardia with mild ST depression in inferior leads and markedly elevated d-dimer. Started on heparin drip and VQ obtained showing intermediate probability for PE. -Patient showed clinical improvement and was transferred to medical floor .  Hospital Course:  Septic shock secondary to Escherichia coli UTI with pyelonephritis -Possibly from underlying pyelonephritis and possible PNA.Marland Kitchen Urine culture growing Escherichia coli which is mostly sensitive. -Sepsis now resolved. -On empiric Levaquin with plan to treat for 2 weeks antibiotic course..     Acute PE -Elevated d-dimer with intermediate probability for PE on VQ scan. Started on IV heparin and Coumadin. Doppler negative for DVT of LE and LUE. 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 -She will need lifelong anticoagulation with Coumadin. -INR therapeutic on Coumadin and IV heparin discontinued.  -Patient should follow-up with her PCP and her INR monitored as outpatient .   Lupus anticoagulant positive As outlined above patient will need lifelong anticoagulation. She has been instructed on use of Coumadin, dietary interactions and monitoring for any signs of bleeding and seek immediate medical attention if she has any bleeding issues.  Elevated troponin No chest pain or changes on telemetry. Likely demand ischemia. Troponin plateud. 2D echo with normal EF and no wall motion abnormalities.  Acute tubular necrosis with metabolic acidosis Possibly prerenal due to hypotension and sepsis.  creatinine peaked at 2.79. Patient  reported taking some Motrin and  Naprosyn prior to admission. FeNa >1 -low c3, normal C4, , Hbs antibody, HCV  antibody and HIVab all negative. Uric acid normal. -Renal function improving (creatinine of 1.29 this morning). metabolic acidosis resolved.Marland Kitchen. Appreciate nephrology consult.   Hypokalemia Replenished with kcl. Will send her on potassium supplements    Acute blurred vision / visual loss on 1/15 overnight Rapid response was called for the same. No neurological deficit noted. Vitals were stable. Patient reports blurred vision lasting for about a minute and complete loss of vision for a few seconds. Neurology was consulted however no code stroke was called given patient being on blood thinner and rapid resolution of symptoms. Head CT negative.  No clear etiology for her symptoms. No further symptoms.  Abnormal TSH  has mildly elevated TSH and suppressed freeT3. Could be sick euthyroid due to sepsis. Please monitor repeatTSH in few weeks.   Protein calorie malnutrition nutrition consulted and added supplement.    Diet: Regular    Code Status: full code Family Communication: none at bedside Disposition Plan:  Home with outpatient follow-up within next 3-4 days  Consultants:  PCCM  renal   procedure  2D echo  VQ scan  Doppler extremities  Antibiotics:  IV vanco and zosyn (1/10--1/14)  levaquin 1/14--until 1/24    Discharge Exam: Filed Vitals:   08/21/14 0558  BP: 134/80  Pulse: 71  Temp: 98.6 F (37 C)  Resp: 18     General: Middle aged female no acute distress  HEENT: , Pallor, supple neck, Moist oral mucosa  Chest: Clear to auscultation bilaterally, no added sounds  CVS: S1 and S2 normal,, no murmurs or gallop  gastrointestinal: Soft, nondistended, non tender,   Musculoskeletal: Warm, no edema,   CNS: Alert and oriented Discharge Instructions    Current Discharge Medication List    START taking these medications   Details  feeding supplement, RESOURCE BREEZE, (RESOURCE BREEZE) LIQD Take 1 Container by mouth 2 (two) times daily  between meals. Qty: 60 Container, Refills: 0    levofloxacin (LEVAQUIN) 750 MG tablet Take 1 tablet (750 mg total) by mouth daily. Qty: 7 tablet, Refills: 0    potassium chloride SA (K-DUR,KLOR-CON) 20 MEQ tablet Take 1 tablet (20 mEq total) by mouth daily. Qty: 20 tablet, Refills: 0    warfarin (COUMADIN) 5 MG tablet Take 1 tablet (5 mg total) by mouth daily at 6 PM. Qty: 30 tablet, Refills: 0      STOP taking these medications     aspirin 325 MG tablet      naproxen (NAPROSYN) 500 MG tablet        No Known Allergies Follow-up Information    Follow up with Hillery AldoPATEL, SARAH, MD.   Specialty:  Family Medicine   Why:   Make an appointment for 2 to 3days to be seen   Contact information:   [address] [place] [id number] [phone]        The results of significant diagnostics from this hospitalization (including imaging, microbiology, ancillary and laboratory) are listed below for reference.    Significant Diagnostic Studies: Ct Head Wo Contrast  08/19/2014   CLINICAL DATA:  44 year old with headache and visual changes.  EXAM: CT HEAD WITHOUT CONTRAST  TECHNIQUE: Contiguous axial images were obtained from the base of the skull through the vertex without contrast.  COMPARISON:  04/01/2013  FINDINGS: Normal appearance of the intracranial structures. No evidence for acute hemorrhage, mass lesion, midline shift, hydrocephalus or large infarct. No acute  bony abnormality. The visualized sinuses are clear.  IMPRESSION: Negative head CT.   Electronically Signed   By: Richarda Overlie M.D.   On: 08/19/2014 08:17   Nm Pulmonary Perf And Vent  08/16/2014   CLINICAL DATA:  Shortness of breath  EXAM: NUCLEAR MEDICINE VENTILATION - PERFUSION LUNG SCAN  TECHNIQUE: Ventilation images were obtained in multiple projections using inhaled aerosol technetium 99 M DTPA. Perfusion images were obtained in multiple projections after intravenous injection of Tc-1m MAA.  RADIOPHARMACEUTICALS:   40.0 mCi Tc-83m DTPA aerosol and 6.0 mCi Tc-85m MAA  COMPARISON:  None.  FINDINGS: Perfusion: There is blunting of the costophrenic angles bilaterally. Moderate size matched right posterior basal perfusion defect. Otherwise no focal ventilation defect.  Ventilation: There is blunting of the costophrenic angles bilaterally. Moderate-sized mass right posterior basal ventilation defect. Otherwise no wedge shaped peripheral perfusion defects to suggest acute pulmonary embolism. There is slight heterogeneous profusion.  Correlation with chest x-ray which was performed 08/16/2014.  IMPRESSION: 1. Triple match right basilar defect. Intermediate probability for pulmonary embolus (20-79%).   Electronically Signed   By: Elige Ko   On: 08/16/2014 16:41   Dg Chest Port 1 View  08/16/2014   CLINICAL DATA:  44 year old female with respiratory failure.  EXAM: PORTABLE CHEST - 1 VIEW  COMPARISON:  No priors.  FINDINGS: Lung volumes are low. Bibasilar opacities may reflect areas of atelectasis and/or consolidation, with superimposed moderate bilateral pleural effusions. Vascular crowding, likely accentuated by low lung volumes. Heart size is borderline enlarged. Upper mediastinal contours are poorly evaluated secondary to patient's positioning, low lung volumes and portable technique.  IMPRESSION: 1. Bibasilar atelectasis and/or consolidation, concerning for potential sequela of aspiration or underlying pneumonia. Moderate bilateral pleural effusions (right greater than left). 2. Borderline cardiomegaly with pulmonary venous congestion, but no frank pulmonary edema.   Electronically Signed   By: Trudie Reed M.D.   On: 08/16/2014 07:37   Ct Renal Stone Study  08/14/2014   CLINICAL DATA:  Acute onset of left flank pain and upper abdominal pain. Initial encounter.  EXAM: CT ABDOMEN AND PELVIS WITHOUT CONTRAST  TECHNIQUE: Multidetector CT imaging of the abdomen and pelvis was performed following the standard protocol  without IV contrast.  COMPARISON:  Pelvic ultrasound performed 02/18/2003  FINDINGS: Mild bibasilar opacities likely reflect atelectasis.  There is apparent gallbladder wall thickening, raising concern for mild cholecystitis. Would correlate for associated symptoms.  Minimal vague calcification within the liver may reflect remote injury. The spleen is unremarkable in appearance. The pancreas and adrenal glands are grossly unremarkable.  There is diffuse bilateral perinephric stranding, nonspecific in appearance. Would correlate clinically for evidence of pyelonephritis. There is no evidence of hydronephrosis. No renal or ureteral stones are seen.  No free fluid is identified. The small bowel is unremarkable in appearance. The stomach is within normal limits. No acute vascular abnormalities are seen.  The appendix is normal in caliber and contains air, without evidence for appendicitis. An apparent calcified epiploic appendage is seen along the distal descending colon. The colon is unremarkable in appearance.  The bladder is mildly distended and grossly unremarkable. The uterus is unremarkable in appearance. The ovaries are relatively symmetric. No suspicious adnexal masses are seen. No inguinal lymphadenopathy is seen.  No acute osseous abnormalities are identified.  IMPRESSION: 1. Diffuse nonspecific bilateral perinephric stranding; would correlate clinically for evidence of pyelonephritis. 2. Apparent gallbladder wall thickening could reflect mild cholecystitis. Would correlate for associated symptoms. 3. Mild bibasilar airspace opacities likely  reflect atelectasis.   Electronically Signed   By: Roanna Raider M.D.   On: 08/14/2014 00:59   US Abdomen Limited Ruq  08/15/2014   CLINICAL DATA:  Upper abdominal pain. Suspected gallbladder wall thickening based on CT scan.  EXAM: US ABDOMEN LIMITED - RIGHT UPPER QUADRANT  COMPARISON:  08/14/2014  FINDINGS: Gallbladder:  Tumefactive sludge in the gallbladder. No  gallbladder wall thickening or pericholecystic fluid. Sonographic Murphy's sign absent.  Common bile duct:  Diameter: Mildly dilated at 6 mm, but without directly visualized choledocholithiasis.  Liver:  No focal lesion identified. Within normal limits in parenchymal echogenicity.  Other: Right pleural effusion.  Echogenic right kidney, image 13.  IMPRESSION: 1. Tumefactive sludge in the gallbladder, but no gallbladder wall thickening, pericholecystic fluid, or sonographic Murphy sign. 2. Mildly dilated CBD, without directly visualized choledocholithiasis. 3. Right pleural effusion. 4. Abnormal echogenic right kidney -differential diagnostic considerations include acute and chronic glomerular nephritis, nephrosclerosis, acute tubular necrosis, chronic pyelonephritis, diabetic nephropathy, and others.   Electronically Signed   By: Herbie Baltimore M.D.   On: 08/15/2014 16:01    Microbiology: Recent Results (from the past 240 hour(s))  Urine culture     Status: None   Collection Time: 08/13/14  9:41 PM  Result Value Ref Range Status   Specimen Description URINE, CLEAN CATCH  Final   Special Requests NONE  Final   Colony Count   Final    >=100,000 COLONIES/ML Performed at [facility]    Culture   Final    ESCHERICHIA COLI Performed at [facility]    Report Status 08/16/2014 FINAL  Final   Organism ID, Bacteria ESCHERICHIA COLI  Final      Susceptibility   Escherichia coli - MIC*    AMPICILLIN >=32 RESISTANT Resistant     CEFAZOLIN <=4 SENSITIVE Sensitive     CEFTRIAXONE <=1 SENSITIVE Sensitive     CIPROFLOXACIN <=0.25 SENSITIVE Sensitive     GENTAMICIN <=1 SENSITIVE Sensitive     LEVOFLOXACIN <=0.12 SENSITIVE Sensitive     NITROFURANTOIN <=16 SENSITIVE Sensitive     TOBRAMYCIN <=1 SENSITIVE Sensitive     TRIMETH/SULFA >=320 RESISTANT Resistant     PIP/TAZO <=4 SENSITIVE Sensitive     * ESCHERICHIA COLI  MRSA PCR Screening     Status: None   Collection Time: 08/14/14  11:22 AM  Result Value Ref Range Status   MRSA by PCR NEGATIVE NEGATIVE Final    Comment:        The GeneXpert MRSA Assay (FDA approved for NASAL specimens only), is one component of a comprehensive MRSA colonization surveillance program. It is not intended to diagnose MRSA infection nor to guide or monitor treatment for MRSA infections.   Culture, blood (routine x 2)     Status: None   Collection Time: 08/14/14  2:02 PM  Result Value Ref Range Status   Specimen Description BLOOD RIGHT ANTECUBITAL  Final   Special Requests BOTTLES DRAWN AEROBIC AND ANAEROBIC 10CC  Final   Culture   Final    NO GROWTH 5 DAYS Note: Culture results may be compromised due to an excessive volume of blood received in culture bottles. Performed at [facility]    Report Status 08/20/2014 FINAL  Final  Culture, blood (routine x 2)     Status: None   Collection Time: 08/14/14  2:15 PM  Result Value Ref Range Status   Specimen Description BLOOD RIGHT HAND  Final   Special Requests BOTTLES DRAWN  AEROBIC AND ANAEROBIC 10CC  Final   Culture   Final    NO GROWTH 5 DAYS Note: Culture results may be compromised due to an excessive volume of blood received in culture bottles. Performed at [facility]    Report Status 08/20/2014 FINAL  Final     Labs: Basic Metabolic Panel:  Recent Labs Lab 08/16/14 1930 08/17/14 0131 08/18/14 0325 08/19/14 0615 08/20/14 0603 08/21/14 0642  NA  --  142 140 142 140 138  K  --  3.2* 3.6 2.8* 3.2* 3.2*  CL  --  119* 114* 112 108 107  CO2  --  16* 18* 25 24 27   GLUCOSE  --  120* 93 113* 110* 104*  BUN  --  16 16 15 9 7   CREATININE  --  2.41* 1.90* 1.61* 1.40* 1.29*  CALCIUM  --  7.9* 7.9* 7.9* 8.1* 8.5  MG 2.1  --   --   --   --   --    Liver Function Tests:  Recent Labs Lab 08/15/14 0930 08/16/14 0540  AST 20 21  ALT 13 12  ALKPHOS 76 85  BILITOT 1.9* 1.2  PROT 5.0* 4.8*  ALBUMIN 1.6* 1.5*   No results for input(s): LIPASE, AMYLASE  in the last 168 hours. No results for input(s): AMMONIA in the last 168 hours. CBC:  Recent Labs Lab 08/17/14 0131 08/18/14 0802 08/19/14 0615 08/20/14 0603 08/21/14 0642  WBC 12.6* 11.4* 11.4* 11.1* 10.3  HGB 10.5* 12.1 10.8* 11.2* 11.5*  HCT 29.0* 34.7* 31.3* 31.5* 32.7*  MCV 82.6 83.0 81.7 81.2 81.5  PLT 201 223 236 239 297   Cardiac Enzymes:  Recent Labs Lab 08/16/14 1225 08/16/14 1930 08/17/14 0130  TROPONINI 0.03 0.06* 0.06*   BNP: BNP (last 3 results) No results for input(s): PROBNP in the last 8760 hours. CBG: No results for input(s): GLUCAP in the last 168 hours.     SignedEddie North  [facility] 08/21/2014, 11:45 AM

## 2021-09-20 ENCOUNTER — Other Ambulatory Visit: Payer: Self-pay | Admitting: Family Medicine

## 2021-09-20 ENCOUNTER — Ambulatory Visit
Admission: RE | Admit: 2021-09-20 | Discharge: 2021-09-20 | Disposition: A | Discharge status: Home / Self Care | Payer: No Typology Code available for payment source | Source: Ambulatory Visit | Attending: Family Medicine | Admitting: Family Medicine

## 2021-09-20 DIAGNOSIS — G8929 Other chronic pain: Secondary | ICD-10-CM

## 2023-08-15 IMAGING — DX DG SHOULDER 2+V*L*
4 series · 4 of 4 positions shown · non-contrast
Comparison: None.

CLINICAL DATA: 2 week history of left shoulder pain after lifting
injury.

EXAM:
LEFT SHOULDER - 2+ VIEW

[dg shoulder left (1 of 4)]
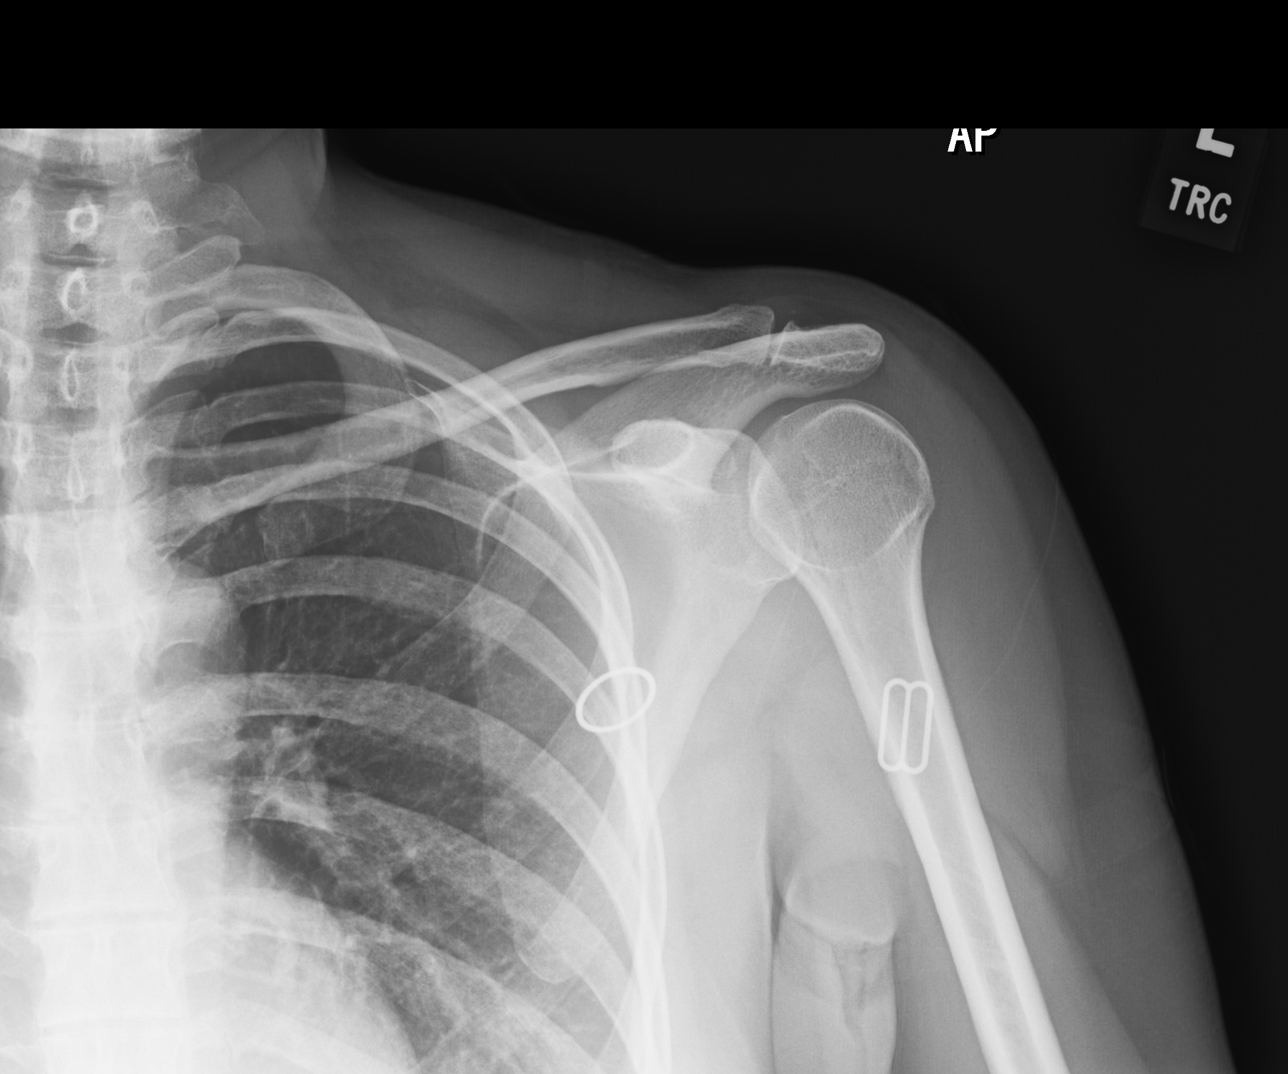

[dg shoulder left (2 of 4)]
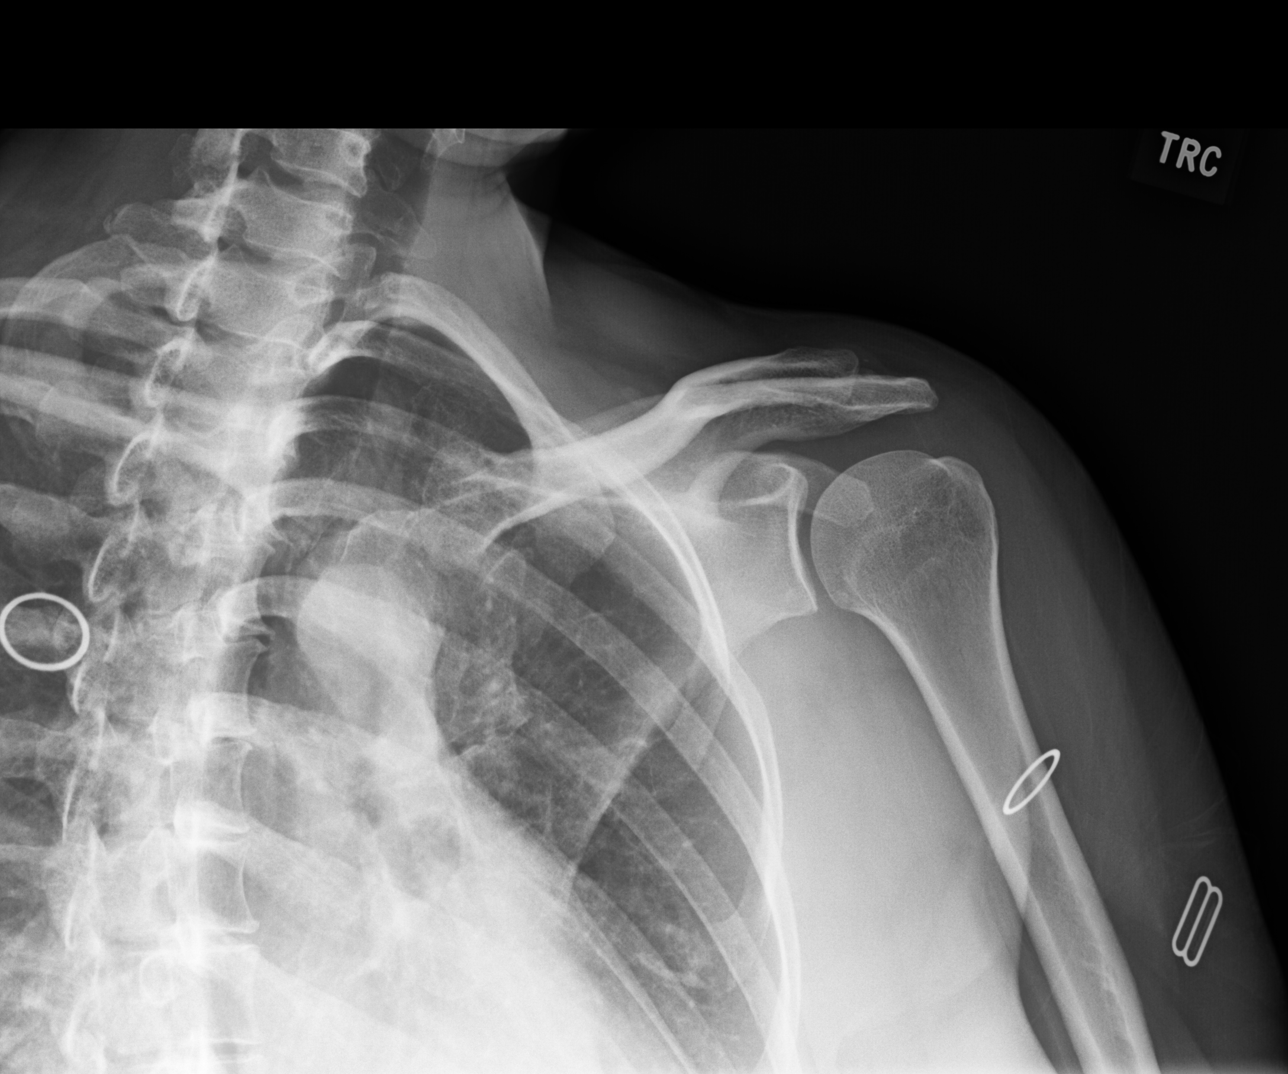

[dg shoulder left (3 of 4)]
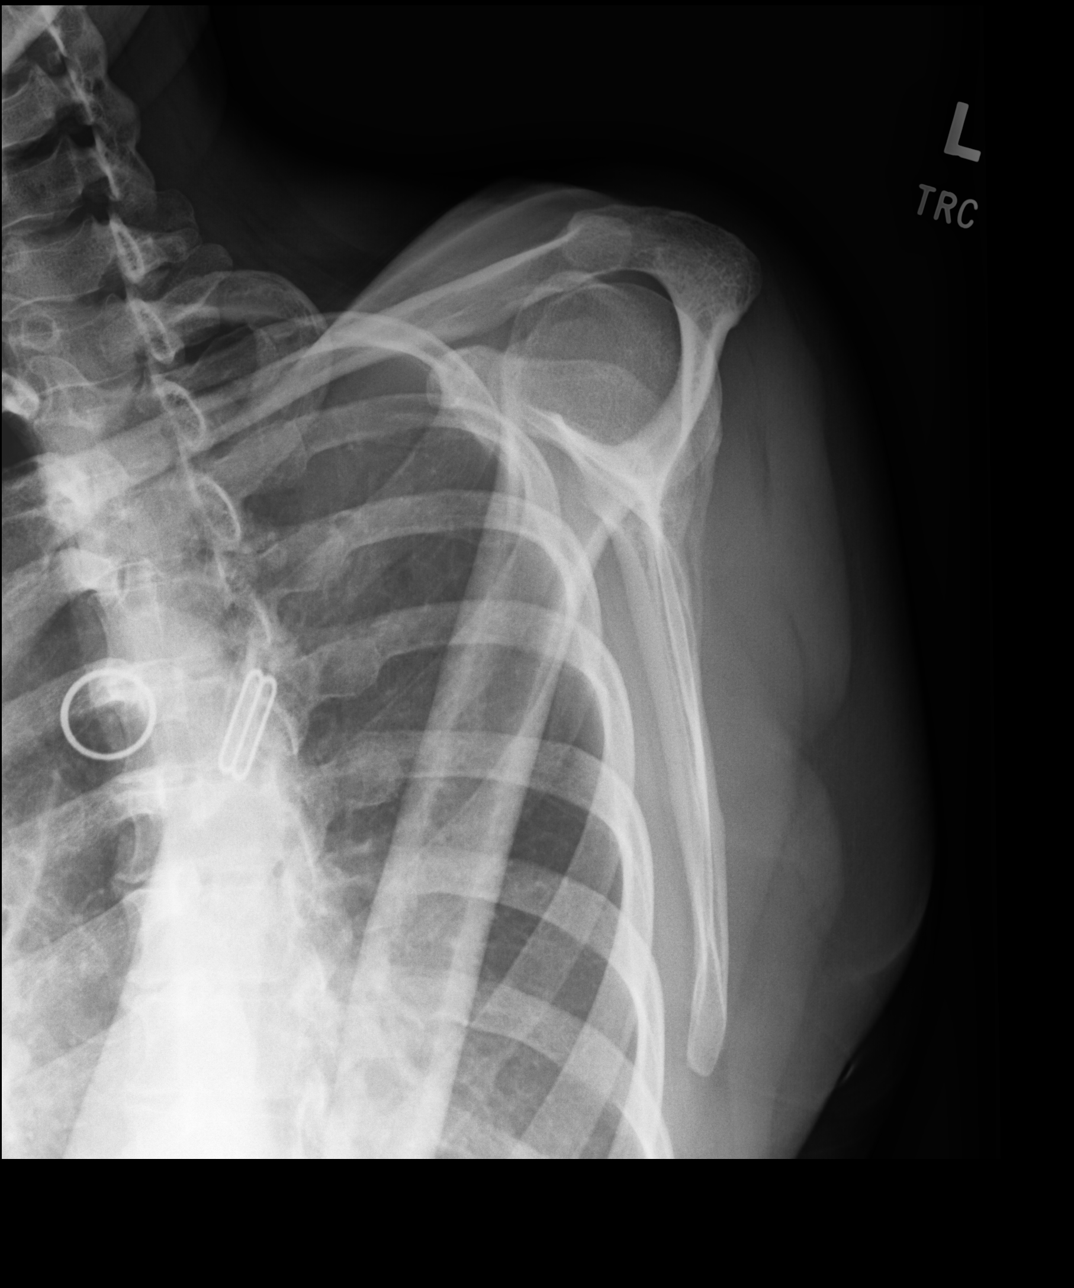

[dg shoulder left (4 of 4)]
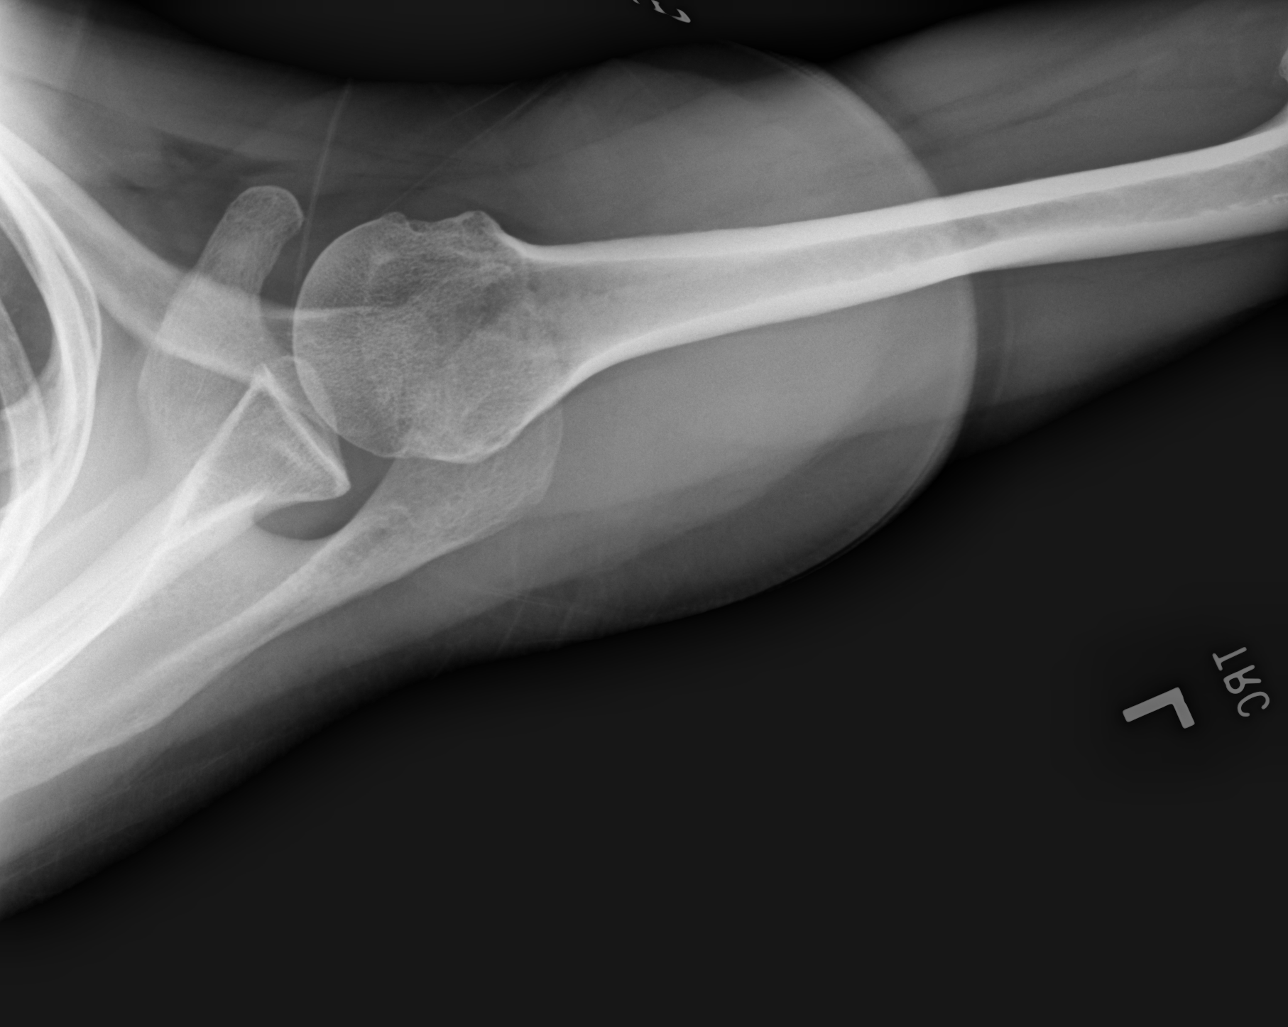

[4 of 4 positions shown; findings below may reference images not displayed]

FINDINGS: There is no evidence of fracture or dislocation. There is no
evidence of arthropathy or other focal bone abnormality. Soft
tissues are unremarkable.
IMPRESSION: Negative.

## 2023-12-23 ENCOUNTER — Institutional Professional Consult (permissible substitution): Payer: Self-pay | Admitting: Surgical

## 2024-01-15 ENCOUNTER — Institutional Professional Consult (permissible substitution): Payer: Self-pay | Admitting: Surgical
